# Patient Record
Sex: Male | Born: 1995 | Race: White | Hispanic: No | Smoking: Current every day smoker
Health system: Southern US, Community
[De-identification: ages and names within clinical notes are randomized; demographics above are authoritative.]

## PROBLEM LIST (undated history)

## (undated) DIAGNOSIS — Z87442 Personal history of urinary calculi: Secondary | ICD-10-CM

## (undated) DIAGNOSIS — T8859XA Other complications of anesthesia, initial encounter: Secondary | ICD-10-CM

## (undated) DIAGNOSIS — K219 Gastro-esophageal reflux disease without esophagitis: Secondary | ICD-10-CM

## (undated) DIAGNOSIS — K625 Hemorrhage of anus and rectum: Secondary | ICD-10-CM

## (undated) DIAGNOSIS — R109 Unspecified abdominal pain: Secondary | ICD-10-CM

## (undated) DIAGNOSIS — R319 Hematuria, unspecified: Secondary | ICD-10-CM

## (undated) DIAGNOSIS — T4145XA Adverse effect of unspecified anesthetic, initial encounter: Secondary | ICD-10-CM

## (undated) DIAGNOSIS — D649 Anemia, unspecified: Secondary | ICD-10-CM

## (undated) HISTORY — PX: WISDOM TOOTH EXTRACTION: SHX21

## (undated) HISTORY — PX: ELBOW SURGERY: SHX618

## (undated) HISTORY — PX: OTHER SURGICAL HISTORY: SHX169

---
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
## 2014-12-21 ENCOUNTER — Emergency Department: Payer: Self-pay | Admitting: Emergency Medicine

## 2015-10-06 ENCOUNTER — Other Ambulatory Visit: Payer: Self-pay | Admitting: Physician Assistant

## 2015-10-06 DIAGNOSIS — R1031 Right lower quadrant pain: Secondary | ICD-10-CM

## 2015-10-06 DIAGNOSIS — R634 Abnormal weight loss: Secondary | ICD-10-CM

## 2015-10-06 DIAGNOSIS — R1032 Left lower quadrant pain: Principal | ICD-10-CM

## 2015-10-06 DIAGNOSIS — R61 Generalized hyperhidrosis: Secondary | ICD-10-CM

## 2015-10-06 DIAGNOSIS — K625 Hemorrhage of anus and rectum: Secondary | ICD-10-CM

## 2015-10-11 ENCOUNTER — Ambulatory Visit
Admission: RE | Admit: 2015-10-11 | Discharge: 2015-10-11 | Disposition: A | Discharge status: Home / Self Care | Payer: Federal, State, Local not specified - PPO | Source: Ambulatory Visit | Attending: Physician Assistant | Admitting: Physician Assistant

## 2015-10-11 DIAGNOSIS — K625 Hemorrhage of anus and rectum: Secondary | ICD-10-CM | POA: Diagnosis not present

## 2015-10-11 DIAGNOSIS — R1031 Right lower quadrant pain: Secondary | ICD-10-CM | POA: Diagnosis present

## 2015-10-11 DIAGNOSIS — R1032 Left lower quadrant pain: Secondary | ICD-10-CM | POA: Diagnosis present

## 2015-10-11 DIAGNOSIS — R61 Generalized hyperhidrosis: Secondary | ICD-10-CM

## 2015-10-11 DIAGNOSIS — R634 Abnormal weight loss: Secondary | ICD-10-CM

## 2015-10-11 MED ORDER — IOHEXOL 300 MG/ML  SOLN
100.0000 mL | Freq: Once | INTRAMUSCULAR | Status: AC | PRN
  Administered 2015-10-11: 100 mL via INTRAVENOUS

## 2015-10-24 ENCOUNTER — Ambulatory Visit: Payer: Federal, State, Local not specified - PPO | Admitting: Anesthesiology

## 2015-10-24 ENCOUNTER — Ambulatory Visit
Admission: RE | Admit: 2015-10-24 | Discharge: 2015-10-24 | Disposition: A | Discharge status: Home / Self Care | Payer: Federal, State, Local not specified - PPO | Source: Ambulatory Visit | Attending: Gastroenterology | Admitting: Gastroenterology

## 2015-10-24 ENCOUNTER — Encounter: Payer: Self-pay | Admitting: *Deleted

## 2015-10-24 ENCOUNTER — Encounter
Admission: RE | Disposition: A | Discharge status: Home / Self Care | Payer: Self-pay | Source: Ambulatory Visit | Attending: Gastroenterology

## 2015-10-24 DIAGNOSIS — K625 Hemorrhage of anus and rectum: Secondary | ICD-10-CM | POA: Diagnosis not present

## 2015-10-24 DIAGNOSIS — Z809 Family history of malignant neoplasm, unspecified: Secondary | ICD-10-CM | POA: Diagnosis not present

## 2015-10-24 DIAGNOSIS — R634 Abnormal weight loss: Secondary | ICD-10-CM | POA: Insufficient documentation

## 2015-10-24 DIAGNOSIS — Z800 Family history of malignant neoplasm of digestive organs: Secondary | ICD-10-CM | POA: Insufficient documentation

## 2015-10-24 DIAGNOSIS — Z9889 Other specified postprocedural states: Secondary | ICD-10-CM | POA: Diagnosis not present

## 2015-10-24 DIAGNOSIS — Z8349 Family history of other endocrine, nutritional and metabolic diseases: Secondary | ICD-10-CM | POA: Insufficient documentation

## 2015-10-24 DIAGNOSIS — Z8249 Family history of ischemic heart disease and other diseases of the circulatory system: Secondary | ICD-10-CM | POA: Diagnosis not present

## 2015-10-24 DIAGNOSIS — R1031 Right lower quadrant pain: Secondary | ICD-10-CM | POA: Insufficient documentation

## 2015-10-24 DIAGNOSIS — Z79899 Other long term (current) drug therapy: Secondary | ICD-10-CM | POA: Diagnosis not present

## 2015-10-24 DIAGNOSIS — R61 Generalized hyperhidrosis: Secondary | ICD-10-CM | POA: Diagnosis not present

## 2015-10-24 DIAGNOSIS — R1032 Left lower quadrant pain: Secondary | ICD-10-CM | POA: Insufficient documentation

## 2015-10-24 DIAGNOSIS — K219 Gastro-esophageal reflux disease without esophagitis: Secondary | ICD-10-CM | POA: Insufficient documentation

## 2015-10-24 HISTORY — DX: Gastro-esophageal reflux disease without esophagitis: K21.9

## 2015-10-24 HISTORY — PX: COLONOSCOPY WITH PROPOFOL: SHX5780

## 2015-10-24 HISTORY — DX: Hemorrhage of anus and rectum: K62.5

## 2015-10-24 HISTORY — DX: Unspecified abdominal pain: R10.9

## 2015-10-24 HISTORY — DX: Anemia, unspecified: D64.9

## 2015-10-24 HISTORY — DX: Hematuria, unspecified: R31.9

## 2015-10-24 SURGERY — COLONOSCOPY WITH PROPOFOL
Anesthesia: General

## 2015-10-24 MED ORDER — MIDAZOLAM HCL 2 MG/2ML IJ SOLN
INTRAMUSCULAR | Status: DC | PRN
  Administered 2015-10-24: 1 mg via INTRAVENOUS

## 2015-10-24 MED ORDER — SODIUM CHLORIDE 0.9 % IV SOLN
INTRAVENOUS | Status: DC
  Administered 2015-10-24: 15:00:00 via INTRAVENOUS

## 2015-10-24 MED ORDER — PROPOFOL 500 MG/50ML IV EMUL
INTRAVENOUS | Status: DC | PRN
Start: 2015-10-24 — End: 2015-10-24
  Administered 2015-10-24: 110 ug/kg/min via INTRAVENOUS

## 2015-10-24 MED ORDER — FENTANYL CITRATE (PF) 100 MCG/2ML IJ SOLN
INTRAMUSCULAR | Status: DC | PRN
  Administered 2015-10-24: 50 ug via INTRAVENOUS

## 2015-10-24 MED ORDER — SODIUM CHLORIDE 0.9 % IV SOLN: INTRAVENOUS | Status: DC

## 2015-10-24 MED ORDER — SODIUM CHLORIDE 0.9 % IV SOLN
INTRAVENOUS | Status: DC
  Administered 2015-10-24: 1000 mL via INTRAVENOUS

## 2015-10-24 NOTE — Anesthesia Postprocedure Evaluation (Signed)
Anesthesia Post Note  Patient: Barnie AldermanJamison C Girton  Procedure(s) Performed: Procedure(s) (LRB): COLONOSCOPY WITH PROPOFOL (N/A)  Patient location during evaluation: Endoscopy Anesthesia Type: General Level of consciousness: awake Pain management: pain level controlled Vital Signs Assessment: post-procedure vital signs reviewed and stable Respiratory status: spontaneous breathing Cardiovascular status: blood pressure returned to baseline Postop Assessment: No headache Anesthetic complications: no    Last Vitals:  Filed Vitals:   10/24/15 1423 10/24/15 1514  BP: 90/70 89/62  Pulse: 118 79  Temp: 36.8 C 36.3 C  Resp: 18     Last Pain: There were no vitals filed for this visit.               Bonnell Placzek M

## 2015-10-24 NOTE — H&P (Signed)
  Date of Initial H&P: 10/06/2015 History reviewed, patient examined, no change in status, stable for surgery. 

## 2015-10-24 NOTE — Anesthesia Preprocedure Evaluation (Signed)
Anesthesia Evaluation  Patient identified by MRN, date of birth, ID band Patient awake    Reviewed: Allergy & Precautions, NPO status , Patient's Chart, lab work & pertinent test results  Airway Mallampati: I  TM Distance: >3 FB Neck ROM: Full    Dental  (+) Teeth Intact   Pulmonary    Pulmonary exam normal        Cardiovascular Exercise Tolerance: Good negative cardio ROS Normal cardiovascular exam     Neuro/Psych    GI/Hepatic GERD  Medicated and Controlled,  Endo/Other    Renal/GU      Musculoskeletal   Abdominal Normal abdominal exam  (+)   Peds  Hematology   Anesthesia Other Findings   Reproductive/Obstetrics                             Anesthesia Physical Anesthesia Plan  ASA: I  Anesthesia Plan: General   Post-op Pain Management:    Induction: Intravenous  Airway Management Planned: Nasal Cannula  Additional Equipment:   Intra-op Plan:   Post-operative Plan:   Informed Consent: I have reviewed the patients History and Physical, chart, labs and discussed the procedure including the risks, benefits and alternatives for the proposed anesthesia with the patient or authorized representative who has indicated his/her understanding and acceptance.     Plan Discussed with: CRNA  Anesthesia Plan Comments:         Anesthesia Quick Evaluation

## 2015-10-24 NOTE — Op Note (Signed)
[facility] Patient Name: Dustin Butler Procedure Date: 10/24/2015 2:49 PM MRN: [id number] Account #: [id number] Date of Birth: [birth year] Admit Type: Outpatient Age: 19 Room: [facility] ENDO ROOM 4 Gender: Male Note Status: Finalized Procedure:         Colonoscopy Indications:       Lower abdominal pain, Rectal bleeding, Weight loss Providers:         Ezzard Standing. Bluford Kaufmann, MD Referring MD:      Marisue Ivan (Referring MD) Medicines:         Monitored Anesthesia Care Complications:     No immediate complications. Procedure:         Pre-Anesthesia Assessment:                    - Prior to the procedure, a History and Physical was                     performed, and patient medications, allergies and                     sensitivities were reviewed. The patient's tolerance of                     previous anesthesia was reviewed.                    - The risks and benefits of the procedure and the sedation                     options and risks were discussed with the patient. All                     questions were answered and informed consent was obtained.                    - The risks and benefits of the procedure and the sedation                     options and risks were discussed with the patient. All                     questions were answered and informed consent was obtained.                    - The risks and benefits of the procedure and the sedation                     options and risks were discussed with the patient. All                     questions were answered and informed consent was obtained.                    - After reviewing the risks and benefits, the patient was                     deemed in satisfactory condition to undergo the procedure.                    After obtaining informed consent, the colonoscope was                     passed under direct vision. Throughout the procedure, the  patient's blood pressure,  pulse, and oxygen saturations                     were monitored continuously. The Colonoscope was                     introduced through the anus and advanced to the the cecum,                     identified by appendiceal orifice and ileocecal valve. The                     colonoscopy was performed without difficulty. The patient                     tolerated the procedure well. The quality of the bowel                     preparation was good. Findings:      The colon (entire examined portion) appeared normal. Spent lot of time       trying to intubate TI without success. Biopsies taken from TOI entrance.      The exam was otherwise without abnormality. Impression:        - The entire examined colon is normal.                    - The examination was otherwise normal.                    - No specimens collected. Recommendation:    - Discharge patient to home.                    - Await pathology results.                    - The findings and recommendations were discussed with the                     patient.                    - Order CT of abdomen and pelvis to evaluate TI better. Procedure Code(s): --- Professional ---                    [phone], Colonoscopy, flexible; diagnostic, including                     collection of specimen(s) by brushing or washing, when                     performed (separate procedure) Diagnosis Code(s): --- Professional ---                    R10.30, Lower abdominal pain, unspecified                    K62.5, Hemorrhage of anus and rectum                    R63.4, Abnormal weight loss CPT copyright 2014 American Medical Association. All rights reserved. The codes documented in this report are preliminary and upon coder review may  be revised to meet current compliance requirements. Wallace Cullens, MD 10/24/2015 3:10:51 PM This report has been signed electronically. Number of Addenda: 0 Note Initiated On: 10/24/2015 2:49 PM  Scope Withdrawal Time: 0  hours 8 minutes 47 seconds  Total Procedure Duration: 0 hours 12 minutes 10 seconds       [facility]

## 2015-10-24 NOTE — Transfer of Care (Signed)
Immediate Anesthesia Transfer of Care Note  Patient: Dustin Butler  Procedure(s) Performed: Procedure(s): COLONOSCOPY WITH PROPOFOL (N/A)  Patient Location: PACU and Endoscopy Unit  Anesthesia Type:General  Level of Consciousness: sedated and responds to stimulation  Airway & Oxygen Therapy: Patient Spontanous Breathing and Patient connected to nasal cannula oxygen  Post-op Assessment: Report given to RN and Post -op Vital signs reviewed and stable  Post vital signs: Reviewed and stable  Last Vitals:  Filed Vitals:   10/24/15 1423 10/24/15 1514  BP: 90/70 89/62  Pulse: 118 79  Temp: 36.8 C 36.3 C  Resp: 18     Complications: No apparent anesthesia complications

## 2015-10-25 ENCOUNTER — Encounter: Payer: Self-pay | Admitting: Gastroenterology

## 2015-10-26 LAB — SURGICAL PATHOLOGY

## 2016-06-11 ENCOUNTER — Emergency Department
Admission: EM | Admit: 2016-06-11 | Discharge: 2016-06-11 | Disposition: A | Discharge status: Home / Self Care | Payer: Federal, State, Local not specified - PPO | Attending: Emergency Medicine | Admitting: Emergency Medicine

## 2016-06-11 ENCOUNTER — Emergency Department: Payer: Federal, State, Local not specified - PPO

## 2016-06-11 ENCOUNTER — Encounter: Payer: Self-pay | Admitting: Emergency Medicine

## 2016-06-11 DIAGNOSIS — Y939 Activity, unspecified: Secondary | ICD-10-CM | POA: Insufficient documentation

## 2016-06-11 DIAGNOSIS — Z79899 Other long term (current) drug therapy: Secondary | ICD-10-CM | POA: Diagnosis not present

## 2016-06-11 DIAGNOSIS — F17210 Nicotine dependence, cigarettes, uncomplicated: Secondary | ICD-10-CM | POA: Diagnosis not present

## 2016-06-11 DIAGNOSIS — S62101A Fracture of unspecified carpal bone, right wrist, initial encounter for closed fracture: Secondary | ICD-10-CM

## 2016-06-11 DIAGNOSIS — S6991XA Unspecified injury of right wrist, hand and finger(s), initial encounter: Secondary | ICD-10-CM | POA: Diagnosis present

## 2016-06-11 DIAGNOSIS — M25511 Pain in right shoulder: Secondary | ICD-10-CM | POA: Diagnosis not present

## 2016-06-11 DIAGNOSIS — Z8719 Personal history of other diseases of the digestive system: Secondary | ICD-10-CM | POA: Diagnosis not present

## 2016-06-11 DIAGNOSIS — Y999 Unspecified external cause status: Secondary | ICD-10-CM | POA: Insufficient documentation

## 2016-06-11 DIAGNOSIS — S62111A Displaced fracture of triquetrum [cuneiform] bone, right wrist, initial encounter for closed fracture: Secondary | ICD-10-CM | POA: Insufficient documentation

## 2016-06-11 DIAGNOSIS — Y929 Unspecified place or not applicable: Secondary | ICD-10-CM | POA: Diagnosis not present

## 2016-06-11 MED ORDER — OXYCODONE-ACETAMINOPHEN 5-325 MG PO TABS
2.0000 | ORAL_TABLET | Freq: Once | ORAL | Status: AC
  Administered 2016-06-11: 2 via ORAL
  Filled 2016-06-11: qty 2

## 2016-06-11 MED ORDER — OXYCODONE-ACETAMINOPHEN 5-325 MG PO TABS: 1.0000 | ORAL_TABLET | ORAL | Status: DC | PRN

## 2016-06-11 MED ORDER — NAPROXEN 500 MG PO TABS: 500.0000 mg | ORAL_TABLET | Freq: Two times a day (BID) | ORAL | Status: DC
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
## 2016-06-11 NOTE — ED Notes (Signed)
Wrist immobilized and Ice applied.

## 2016-06-11 NOTE — Discharge Instructions (Signed)
Wrist Fracture °A wrist fracture is a break or crack in one of the bones of your wrist. Your wrist is made up of eight small bones at the palm of your hand (carpal bones) and two long bones that make up your forearm (radius and ulna). °CAUSES °· A direct blow to the wrist. °· Falling on an outstretched hand. °· Trauma, such as a car accident or a fall. °RISK FACTORS °Risk factors for wrist fracture include: °· Participating in contact and high-risk sports, such as skiing, biking, and ice skating. °· Taking steroid medicines. °· Smoking. °· Being male. °· Being Caucasian. °· Drinking more than three alcoholic beverages per day. °· Having low or lowered bone density (osteoporosis or osteopenia). °· Age. Older adults have decreased bone density. °· Women who have had menopause. °· History of previous fractures. °SIGNS AND SYMPTOMS °Symptoms of wrist fractures include tenderness, bruising, and inflammation. Additionally, the wrist may hang in an odd position or appear deformed. °DIAGNOSIS °Diagnosis may include: °· Physical exam. °· X-ray. °TREATMENT °Treatment depends on many factors, including the nature and location of the fracture, your age, and your activity level. Treatment for wrist fracture can be nonsurgical or surgical. °Nonsurgical Treatment °A plaster cast or splint may be applied to your wrist if the bone is in a good position. If the fracture is not in good position, it may be necessary for your health care provider to realign it before applying a splint or cast. Usually, a cast or splint will be worn for several weeks. °Surgical Treatment °Sometimes the position of the bone is so far out of place that surgery is required to apply a device to hold it together as it heals. Depending on the fracture, there are a number of options for holding the bone in place while it heals, such as a cast and metal pins. °HOME CARE INSTRUCTIONS °· Keep your injured wrist elevated and move your fingers as much as  possible. °· Do not put pressure on any part of your cast or splint. It may break. °· Use a plastic bag to protect your cast or splint from water while bathing or showering. Do not lower your cast or splint into water. °· Take medicines only as directed by your health care provider. °· Keep your cast or splint clean and dry. If it becomes wet, damaged, or suddenly feels too tight, contact your health care provider right away. °· Do not use any tobacco products including cigarettes, chewing tobacco, or electronic cigarettes. Tobacco can delay bone healing. If you need help quitting, ask your health care provider. °· Keep all follow-up visits as directed by your health care provider. This is important. °· Ask your health care provider if you should take supplements of calcium and vitamins C and D to promote bone healing. °SEEK MEDICAL CARE IF: °· Your cast or splint is damaged, breaks, or gets wet. °· You have a fever. °· You have chills. °· You have continued severe pain or more swelling than you did before the cast was put on. °SEEK IMMEDIATE MEDICAL CARE IF: °· Your hand or fingernails on the injured arm turn blue or gray, or feel cold or numb. °· You have decreased feeling in the fingers of your injured arm. °MAKE SURE YOU: °· Understand these instructions. °· Will watch your condition. °· Will get help right away if you are not doing well or get worse. °  °This information is not intended to replace advice given to you by your   health care provider. Make sure you discuss any questions you have with your health care provider. °  °Document Released: 08/29/2005 Document Revised: 08/10/2015 Document Reviewed: 12/07/2011 °Elsevier Interactive Patient Education ©2016 [employer] ° °
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
## 2016-12-12 ENCOUNTER — Other Ambulatory Visit: Payer: Self-pay | Admitting: Internal Medicine

## 2016-12-12 DIAGNOSIS — R319 Hematuria, unspecified: Secondary | ICD-10-CM

## 2016-12-14 ENCOUNTER — Encounter: Payer: Self-pay | Admitting: Emergency Medicine

## 2016-12-14 ENCOUNTER — Emergency Department
Admission: EM | Admit: 2016-12-14 | Discharge: 2016-12-14 | Disposition: A | Discharge status: Home / Self Care | Payer: Federal, State, Local not specified - PPO | Attending: Emergency Medicine | Admitting: Emergency Medicine

## 2016-12-14 ENCOUNTER — Emergency Department: Payer: Federal, State, Local not specified - PPO

## 2016-12-14 DIAGNOSIS — N201 Calculus of ureter: Secondary | ICD-10-CM | POA: Diagnosis not present

## 2016-12-14 DIAGNOSIS — F17210 Nicotine dependence, cigarettes, uncomplicated: Secondary | ICD-10-CM | POA: Diagnosis not present

## 2016-12-14 DIAGNOSIS — R109 Unspecified abdominal pain: Secondary | ICD-10-CM | POA: Diagnosis present

## 2016-12-14 LAB — URINALYSIS, COMPLETE (UACMP) WITH MICROSCOPIC
BACTERIA UA: NONE SEEN
Bilirubin Urine: NEGATIVE
Glucose, UA: NEGATIVE mg/dL
KETONES UR: NEGATIVE mg/dL
NITRITE: NEGATIVE
PROTEIN: 30 mg/dL — AB
Specific Gravity, Urine: 1.021 (ref 1.005–1.030)
Squamous Epithelial / LPF: NONE SEEN
pH: 6 (ref 5.0–8.0)

## 2016-12-14 LAB — CBC WITH DIFFERENTIAL/PLATELET
BASOS ABS: 0 10*3/uL (ref 0–0.1)
Basophils Relative: 0 %
EOS ABS: 0 10*3/uL (ref 0–0.7)
EOS PCT: 0 %
HCT: 42.5 % (ref 40.0–52.0)
Hemoglobin: 14.6 g/dL (ref 13.0–18.0)
Lymphocytes Relative: 5 %
Lymphs Abs: 0.7 10*3/uL — ABNORMAL LOW (ref 1.0–3.6)
MCH: 32.5 pg (ref 26.0–34.0)
MCHC: 34.3 g/dL (ref 32.0–36.0)
MCV: 95 fL (ref 80.0–100.0)
Monocytes Absolute: 0.6 10*3/uL (ref 0.2–1.0)
Monocytes Relative: 3 %
Neutro Abs: 15.1 10*3/uL — ABNORMAL HIGH (ref 1.4–6.5)
Neutrophils Relative %: 92 %
PLATELETS: 195 10*3/uL (ref 150–440)
RBC: 4.48 MIL/uL (ref 4.40–5.90)
RDW: 13.2 % (ref 11.5–14.5)
WBC: 16.5 10*3/uL — AB (ref 3.8–10.6)

## 2016-12-14 LAB — COMPREHENSIVE METABOLIC PANEL
ALT: 14 U/L — AB (ref 17–63)
AST: 24 U/L (ref 15–41)
Albumin: 4.7 g/dL (ref 3.5–5.0)
Alkaline Phosphatase: 65 U/L (ref 38–126)
Anion gap: 8 (ref 5–15)
BUN: 13 mg/dL (ref 6–20)
CHLORIDE: 104 mmol/L (ref 101–111)
CO2: 26 mmol/L (ref 22–32)
CREATININE: 1.09 mg/dL (ref 0.61–1.24)
Calcium: 9.8 mg/dL (ref 8.9–10.3)
GFR calc Af Amer: >60 mL/min (ref >60)
GFR calc non Af Amer: >60 mL/min (ref >60)
Glucose, Bld: 119 mg/dL — ABNORMAL HIGH (ref 65–99)
Potassium: 4.2 mmol/L (ref 3.5–5.1)
SODIUM: 138 mmol/L (ref 135–145)
Total Bilirubin: 0.7 mg/dL (ref 0.3–1.2)
Total Protein: 7.7 g/dL (ref 6.5–8.1)

## 2016-12-14 MED ORDER — NAPROXEN 500 MG PO TABS: 500.0000 mg | ORAL_TABLET | Freq: Two times a day (BID) | ORAL | 2 refills | Status: DC

## 2016-12-14 MED ORDER — ONDANSETRON 4 MG PO TBDP: 4.0000 mg | ORAL_TABLET | Freq: Three times a day (TID) | ORAL | 0 refills | Status: AC | PRN

## 2016-12-14 MED ORDER — TAMSULOSIN HCL 0.4 MG PO CAPS: 0.4000 mg | ORAL_CAPSULE | Freq: Every day | ORAL | 0 refills | Status: DC

## 2016-12-14 MED ORDER — HYDROCODONE-ACETAMINOPHEN 5-325 MG PO TABS: 1.0000 | ORAL_TABLET | ORAL | 0 refills | Status: DC | PRN

## 2016-12-14 MED ORDER — KETOROLAC TROMETHAMINE 30 MG/ML IJ SOLN
30.0000 mg | Freq: Once | INTRAMUSCULAR | Status: AC
  Administered 2016-12-14: 30 mg via INTRAVENOUS
  Filled 2016-12-14: qty 1

## 2016-12-14 NOTE — ED Provider Notes (Signed)
[facility] Emergency Department Provider Note   ____________________________________________    I have reviewed the triage vital signs and the nursing notes.   HISTORY  Chief Complaint Flank Pain     HPI Dustin Butler is a 21 y.o. male who presents with complaints of left flank pain. He also notes hematuria, he reports the pain started yesterday and was mild but today became severe. It radiates to his groin. He notes a history of kidney stones in his family. No fevers or chills. No dysuria. Mild nausea, no vomiting. He reports the pain as stabbing and sharp and severe.   Past Medical History:  Diagnosis Date  . Abdominal cramps   . Anemia   . BRBPR (bright red blood per rectum)   . GERD (gastroesophageal reflux disease)   . Hematuria     Patient Active Problem List   Diagnosis Date Noted  . Headache(784.0) 04/14/2013  . Myalgia 04/14/2013  . Dehydration 04/14/2013    Past Surgical History:  Procedure Laterality Date  . COLONOSCOPY WITH PROPOFOL N/A 10/24/2015   Procedure: COLONOSCOPY WITH PROPOFOL;  Surgeon: Wallace Cullens, MD;  Location: [facility] ENDOSCOPY;  Service: Gastroenterology;  Laterality: N/A;  . NO PAST SURGERIES    . spider bite      Prior to Admission medications   Medication Sig Start Date End Date Taking? Authorizing Provider  HYDROcodone-acetaminophen (NORCO/VICODIN) 5-325 MG tablet Take 1 tablet by mouth every 4 (four) hours as needed for moderate pain. 12/14/16   Jene Every, MD  naproxen (NAPROSYN) 500 MG tablet Take 1 tablet (500 mg total) by mouth 2 (two) times daily with a meal. 12/14/16   Jene Every, MD  ondansetron (ZOFRAN ODT) 4 MG disintegrating tablet Take 1 tablet (4 mg total) by mouth every 8 (eight) hours as needed for nausea or vomiting. 12/14/16   Jene Every, MD  tamsulosin (FLOMAX) 0.4 MG CAPS capsule Take 1 capsule (0.4 mg total) by mouth daily. 12/14/16   Jene Every, MD     Allergies Patient has  no known allergies.  No family history on file.  Social History Social History  Substance Use Topics  . Smoking status: Current Every Day Smoker    Packs/day: 1.00    Types: Cigarettes  . Smokeless tobacco: Current User  . Alcohol use No    Review of Systems  Constitutional: No fever/chills  Cardiovascular: Denies chest pain. Respiratory: Denies shortness of breath. Gastrointestinal: As above   Genitourinary: Negative for dysuria. Positive for hematuria Musculoskeletal: Negative for back pain. Skin: Negative for rash.   10-point ROS otherwise negative.  ____________________________________________   PHYSICAL EXAM:  VITAL SIGNS: ED Triage Vitals [12/14/16 1334]  Enc Vitals Group     BP 134/78     Pulse Rate 88     Resp 16     Temp 98.4 F (36.9 C)     Temp Source Oral     SpO2 97 %     Weight 125 lb (56.7 kg)     Height 6' (1.829 m)     Head Circumference      Peak Flow      Pain Score      Pain Loc      Pain Edu?      Excl. in GC?     Constitutional: Alert and oriented. No acute distress. Pleasant and interactive Eyes: Conjunctivae are normal.    Mouth/Throat: Mucous membranes are moist.    Cardiovascular: Normal rate, regular  rhythm. Grossly normal heart sounds.  Good peripheral circulation. Respiratory: Normal respiratory effort.  No retractions. Lungs CTAB. Gastrointestinal: Soft and nontender. No distention.  No CVA tenderness. Genitourinary: deferred Musculoskeletal: No lower extremity tenderness nor edema.  Warm and well perfused Neurologic:  Normal speech and language. No gross focal neurologic deficits are appreciated.  Skin:  Skin is warm, dry and intact. No rash noted. Psychiatric: Mood and affect are normal. Speech and behavior are normal.  ____________________________________________   LABS (all labs ordered are listed, but only abnormal results are displayed)  Labs Reviewed  CBC WITH DIFFERENTIAL/PLATELET - Abnormal; Notable for  the following:       Result Value   WBC 16.5 (*)    Neutro Abs 15.1 (*)    Lymphs Abs 0.7 (*)    All other components within normal limits  COMPREHENSIVE METABOLIC PANEL - Abnormal; Notable for the following:    Glucose, Bld 119 (*)    ALT 14 (*)    All other components within normal limits  URINALYSIS, COMPLETE (UACMP) WITH MICROSCOPIC - Abnormal; Notable for the following:    Color, Urine YELLOW (*)    APPearance HAZY (*)    Hgb urine dipstick LARGE (*)    Protein, ur 30 (*)    Leukocytes, UA TRACE (*)    All other components within normal limits   ____________________________________________  EKG  None ____________________________________________  RADIOLOGY  CT renal stone study ____________________________________________   PROCEDURES  Procedure(s) performed: No    Critical Care performed: No ____________________________________________   INITIAL IMPRESSION / ASSESSMENT AND PLAN / ED COURSE  Pertinent labs & imaging results that were available during my care of the patient were reviewed by me and considered in my medical decision making (see chart for details).  Patient presents with complaints of left flank pain, and hematuria. Strongly suspicious of kidney stone, we will give IV Toradol obtain CT renal stone study and reevaluate.  Clinical Course   Patient's pain relieved by Toradol completely. CT scan demonstrates a 3 mm stone. No evidence of infection. No dysuria. Outpatient follow-up with urology, return precautions discussed including fevers chills dysuria worsening pain ____________________________________________   FINAL CLINICAL IMPRESSION(S) / ED DIAGNOSES  Final diagnoses:  Flank pain, acute  Ureterolithiasis      NEW MEDICATIONS STARTED DURING THIS VISIT:  Discharge Medication List as of 12/14/2016  3:36 PM    START taking these medications   Details  HYDROcodone-acetaminophen (NORCO/VICODIN) 5-325 MG tablet Take 1 tablet by mouth  every 4 (four) hours as needed for moderate pain., Starting Fri 12/14/2016, Print    ondansetron (ZOFRAN ODT) 4 MG disintegrating tablet Take 1 tablet (4 mg total) by mouth every 8 (eight) hours as needed for nausea or vomiting., Starting Fri 12/14/2016, Print    tamsulosin (FLOMAX) 0.4 MG CAPS capsule Take 1 capsule (0.4 mg total) by mouth daily., Starting Fri 12/14/2016, Print         Note:  This document was prepared using Dragon voice recognition software and may include unintentional dictation errors.    Jene Everyobert Chameka Mcmullen, MD 12/14/16 [phone]

## 2016-12-17 ENCOUNTER — Ambulatory Visit: Payer: Federal, State, Local not specified - PPO

## 2016-12-17 ENCOUNTER — Ambulatory Visit (INDEPENDENT_AMBULATORY_CARE_PROVIDER_SITE_OTHER): Payer: Federal, State, Local not specified - PPO | Admitting: Urology

## 2016-12-17 ENCOUNTER — Encounter: Payer: Self-pay | Admitting: Urology

## 2016-12-17 VITALS — BP 104/65 | HR 84 | Ht 72.0 in | Wt 128.3 lb

## 2016-12-17 DIAGNOSIS — R310 Gross hematuria: Secondary | ICD-10-CM

## 2016-12-17 DIAGNOSIS — N201 Calculus of ureter: Secondary | ICD-10-CM

## 2016-12-17 DIAGNOSIS — N200 Calculus of kidney: Secondary | ICD-10-CM | POA: Diagnosis not present

## 2016-12-17 DIAGNOSIS — N132 Hydronephrosis with renal and ureteral calculous obstruction: Secondary | ICD-10-CM

## 2016-12-17 LAB — MICROSCOPIC EXAMINATION
BACTERIA UA: NONE SEEN
Epithelial Cells (non renal): NONE SEEN /hpf (ref 0–10)
RBC, UA: >30 /hpf — AB (ref 0–?)
WBC UA: NONE SEEN /HPF (ref 0–?)

## 2016-12-17 LAB — URINALYSIS, COMPLETE
Bilirubin, UA: NEGATIVE
Glucose, UA: NEGATIVE
Ketones, UA: NEGATIVE
LEUKOCYTES UA: NEGATIVE
Nitrite, UA: NEGATIVE
PH UA: 6.5 (ref 5.0–7.5)
Specific Gravity, UA: 1.02 (ref 1.005–1.030)
Urobilinogen, Ur: 0.2 mg/dL (ref 0.2–1.0)

## 2016-12-17 NOTE — Progress Notes (Signed)
12/17/2016 4:20 PM   Dustin Butler [birth year] [id number]  Referring provider: [name], MD [facility] [place], [place] [address]  Chief Complaint  Patient presents with  . New Patient (Initial Visit)    kidney stone referred by the ER    HPI: Patient is a 21 year old Caucasian male who presents/is referred by [facility] ED for nephrolithiasis.  Patient states the onset of the pain was two weeks ago.   It was sharp.  It lasted for several hours off and on over the last two weeks.  He had gross hematuria associated with the stone,  He has had nausea and vomiting.  He has not had any fevers or chills.  .  The pain was located lower back and radiated to the  and radiated to left flank.  The pain was a 10/10.  Nothing made the pain better.   Nothing made the pain worse.  In the ED, he received Vicodin, Toradol, naprosyn, Zofran and tamsulosin.  His UA demonstrated TNTC RBC's and 6-30 WBC's.  .  Serum creatinine 1.09.  His WBC count was 16.5.    CT Renal stone study was performed on 12/14/2016 noted since the previous study, the patient has developed renal stone disease bilaterally in a pattern suggesting medullary sponge kidney with multiple tiny medullary calcifications bilaterally. On the left, there are 3 slightly larger nonobstructing stones in the kidney measuring about 2 mm in size. There is a 5 mm stone in the left ureter at the L3 level with mild to moderate left hydroureteronephrosis. This has somewhat angular margins.  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  I have independently reviewed the films.     Today, his pain is in his lower stomach.  3/10 pain at this time.  8/10 this morning before taking a Vicodin.  He is taking tamsulosin today.  His has been pushing his fluids.  He has not any recent fevers or chills.  He has not had nausea or vomiting.   UA demonstrates > 30 RBC's.    He does not have a prior  history of stones.    PMH: Past Medical History:  Diagnosis Date  . Abdominal cramps   . Anemia   . BRBPR (bright red blood per rectum)   . GERD (gastroesophageal reflux disease)   . Hematuria     Surgical History: Past Surgical History:  Procedure Laterality Date  . COLONOSCOPY WITH PROPOFOL N/A 10/24/2015   Procedure: COLONOSCOPY WITH PROPOFOL;  Surgeon: [name], MD;  Location: [name] ENDOSCOPY;  Service: Gastroenterology;  Laterality: N/A;  . spider bite      Home Medications:  Allergies as of 12/17/2016   No Known Allergies     Medication List       Accurate as of 12/17/16  4:20 PM. Always use your most recent med list.          HYDROcodone-acetaminophen 5-325 MG tablet Commonly known as:  NORCO/VICODIN Take 1 tablet by mouth every 4 (four) hours as needed for moderate pain.   naproxen 500 MG tablet Commonly known as:  NAPROSYN Take 1 tablet (500 mg total) by mouth 2 (two) times daily with a meal.   ondansetron 4 MG disintegrating tablet Commonly known as:  ZOFRAN ODT Take 1 tablet (4 mg total) by mouth every 8 (eight) hours as needed for nausea or vomiting.   tamsulosin 0.4 MG Caps capsule Commonly known as:  FLOMAX Take 1 capsule (0.4 mg total) by mouth daily.       Allergies: No Known Allergies  Family History: Family History  Problem Relation Age of Onset  . Prostate cancer Father   . Kidney cancer Neg Hx   . Bladder Cancer Neg Hx     Social History:  reports that he has been smoking Cigarettes.  He has been smoking about 1.00 pack per day. He has quit using smokeless tobacco. His smokeless tobacco use included Chew. He reports that he drinks alcohol. He reports that he does not use drugs.  ROS: UROLOGY Frequent Urination?: Yes Hard to postpone urination?: Yes Burning/pain with urination?: No Get up at night to urinate?: No Leakage of urine?: Yes Urine stream starts and stops?: No Trouble starting stream?: No Do you have to strain to  urinate?: No Blood in urine?: Yes Urinary tract infection?: No Sexually transmitted disease?: No Injury to kidneys or bladder?: No Painful intercourse?: No Weak stream?: No Erection problems?: No Penile pain?: No  Gastrointestinal Nausea?: No Vomiting?: No Indigestion/heartburn?: No Diarrhea?: No Constipation?: No  Constitutional Fever: No Night sweats?: No Weight loss?: No Fatigue?: No  Skin Skin rash/lesions?: No Itching?: No  Eyes Blurred vision?: No Double vision?: No  Ears/Nose/Throat Sore throat?: No Sinus problems?: No  Hematologic/Lymphatic Swollen glands?: No Easy bruising?: No  Cardiovascular Leg swelling?: No Chest pain?: No  Respiratory Cough?: No Shortness of breath?: No  Endocrine Excessive thirst?: No  Musculoskeletal Back pain?: Yes Joint pain?: No  Neurological Headaches?: Yes Dizziness?: No  Psychologic Depression?: No Anxiety?: No  Physical Exam: BP 104/65   Pulse 84   Ht 6' (1.829 m)   Wt 128 lb 4.8 oz (58.2 kg)   BMI 17.40 kg/m   Constitutional: Well nourished. Alert and oriented, No acute distress. HEENT: [place] AT, moist mucus membranes. Trachea midline, no masses. Cardiovascular: No clubbing, cyanosis, or edema. Respiratory: Normal respiratory effort, no increased work of breathing. GI: Abdomen is soft, non tender, non distended, no abdominal masses. Liver and spleen not palpable.  No hernias appreciated.  Stool sample for occult testing is not indicated.   GU: No CVA tenderness.  No bladder fullness or masses.   Skin: No rashes, bruises or suspicious lesions. Lymph: No cervical or inguinal adenopathy. Neurologic: Grossly intact, no focal deficits, moving all 4 extremities. Psychiatric: Normal mood and affect.  Laboratory Data: Lab Results  Component Value Date   WBC 16.5 (H) 12/14/2016   HGB 14.6 12/14/2016   HCT 42.5 12/14/2016   MCV 95.0 12/14/2016   PLT 195 12/14/2016    Lab Results  Component Value  Date   CREATININE 1.09 12/14/2016     Lab Results  Component Value Date   AST 24 12/14/2016   Lab Results  Component Value Date   ALT 14 (L) 12/14/2016   Urinalysis > 30 RBC's.  See EPIC.    Pertinent Imaging: CLINICAL DATA:  Left-sided flank pain and hematuria, 2 days duration.  EXAM: CT ABDOMEN AND PELVIS WITHOUT CONTRAST  TECHNIQUE: Multidetector CT imaging of the abdomen and pelvis was performed following the standard protocol without IV contrast.  COMPARISON:  10/11/2015  FINDINGS: Lower chest: Normal  Hepatobiliary: Normal.  No calcified gallstones.  Pancreas: Normal  Spleen: Normal  Adrenals/Urinary Tract: The adrenal glands are normal. Right kidney contains tiny calcifications in the medullary regions possibly indicating medullary sponge kidney. Left kidney shows a similar finding with the re- slightly larger focal stones, 2 mm in diameter, 1  in the upper pole, 1 in the midportion and 1 in the lower pole. 1 cm cyst in the lower medial portion of the kidney and in the lateral midportion of the kidney. Mild to moderate hydroureteronephrosis on the left secondary to a 5 mm stone in the ureter at the L3 level. This has somewhat angular configurations. No stone in the bladder.  Stomach/Bowel: Normal  Vascular/Lymphatic: Normal  Reproductive: Normal  Other: No free fluid or air  Musculoskeletal: Chronic appearing bilateral pars defects at L5 with 3 mm of anterolisthesis.  IMPRESSION: Since the previous study, the patient has developed renal stone disease bilaterally in a pattern suggesting medullary sponge kidney with multiple tiny medullary calcifications bilaterally. On the left, there are 3 slightly larger nonobstructing stones in the kidney measuring about 2 mm in size. There is a 5 mm stone in the left ureter at the L3 level with mild to moderate left hydroureteronephrosis. This has somewhat angular margins.  Chronic bilateral  pars defects at L5 with 3 mm of anterolisthesis.   Electronically Signed   By: Nelson Chimes M.D.   On: 12/14/2016 14:58  Assessment & Plan:    Patient will undergo a left ureteroscopy with laser lithotripsy with ureteral stent placement.    1. Left ureteral stone  - 5 mm mid left ureteral stone  - discussed MET vs ESWL vs URS/LL/stent placement  - patient would like to undergo URS/LL/stent placement  - schedule LEFT ureteroscopy with laser lithotripsy and ureteral stent placement  - explained to the patient how the procedure is performed and the risks involved  - informed patient that they will have a stent placed during the procedure and will remain in place after the procedure for a short time.   - stent may be removed in the office with a cystoscope or patient may be instructed to remove the stent themselves by the string  - described "stent pain" as feelings of needing to urinate/overactive bladder and a warm, tingling sensation to intense pain in the affected flank  - residual stones within the kidney or ureter may be present after the procedure and may need to have these addressed at a different encounter  - injury to the ureter is the most common intra-operative risk, it may result in an open procedure to correct the defect  - infection and bleeding are also risks  - explained the risks of general anesthesia, such as: MI, CVA, paralysis, coma and/or death.  - UA  - urine culture  - advised to contact our office or seek treatment in the ED if becomes febrile or pain/ vomiting are difficult control in order to arrange for emergent/urgent intervention  2. Bilateral renal stones/medullary sponge kidney  - advise 24 hour once obstructing stone is removed  - explained to the patient that may or may not be able to address the left renal stones during the scheduled URS/LL/ureteral stent  3. Left hydronephrosis  - obtain RUS after obstructing stone is removed  4. Gross  hematuria  - We will continue to monitor the patient's UA after the treatment/passage of the stone to ensure the hematuria has resolved.  If hematuria persists, we will pursue a hematuria workup with CT Urogram and cystoscopy if appropriate.    Return for left URS/LL/ureteral stent.  These notes generated with voice recognition software. I apologize for typographical errors.  Zara Council, [facility] [address] [place], [place] [address] [phone]

## 2016-12-18 ENCOUNTER — Telehealth: Payer: Self-pay | Admitting: Radiology

## 2016-12-18 ENCOUNTER — Other Ambulatory Visit: Payer: Self-pay | Admitting: Radiology

## 2016-12-18 DIAGNOSIS — N201 Calculus of ureter: Secondary | ICD-10-CM

## 2016-12-18 NOTE — Telephone Encounter (Signed)
LMOM. Need to discuss upcoming surgery with Dr Brandon. 

## 2016-12-20 ENCOUNTER — Inpatient Hospital Stay: Admission: RE | Admit: 2016-12-20 | Payer: Self-pay | Source: Ambulatory Visit

## 2016-12-21 ENCOUNTER — Encounter
Admission: RE | Admit: 2016-12-21 | Discharge: 2016-12-21 | Disposition: A | Discharge status: Home / Self Care | Payer: Federal, State, Local not specified - PPO | Source: Ambulatory Visit | Attending: Urology | Admitting: Urology

## 2016-12-21 HISTORY — DX: Adverse effect of unspecified anesthetic, initial encounter: T41.45XA

## 2016-12-21 HISTORY — DX: Personal history of urinary calculi: Z87.442

## 2016-12-21 HISTORY — DX: Other complications of anesthesia, initial encounter: T88.59XA

## 2016-12-21 NOTE — Telephone Encounter (Signed)
Notified pt of surgery scheduled with Dr Apolinar JunesBrandon on 12/25/16, pre-admit testing phone interview 12/21/16 between 1-5pm & to call day prior to surgery for arrival time to SDS. Pt voices understanding.

## 2016-12-21 NOTE — Patient Instructions (Signed)
  Your procedure is scheduled on: 12-25-16 Report to Same Day Surgery 2nd floor medical mall [facility](Medical Mall Entrance-take elevator on left to 2nd floor.  Check in with surgery information desk.) To find out your arrival time please call [phone] between 1PM - 3PM on 12-24-16  Remember: Instructions that are not followed completely may result in serious medical risk, up to and including death, or upon the discretion of your surgeon and anesthesiologist your surgery may need to be rescheduled.    _x___ 1. Do not eat food or drink liquids after midnight. No gum chewing or hard candies.     __x__ 2. No Alcohol for 24 hours before or after surgery.   __x__3. No Smoking for 24 prior to surgery.   ____  4. Bring all medications with you on the day of surgery if instructed.    __x__ 5. Notify your doctor if there is any change in your medical condition     (cold, fever, infections).     Do not wear jewelry, make-up, hairpins, clips or nail polish.  Do not wear lotions, powders, or perfumes. You may wear deodorant.  Do not shave 48 hours prior to surgery. Men may shave face and neck.  Do not bring valuables to the hospital.    [facility] not [facility] for any belongings or valuables.               Contacts, dentures or bridgework may not be worn into surgery.  Leave your suitcase in the car. After surgery it may be brought to your room.  For patients admitted to the hospital, discharge time is determined by your treatment team.   Patients discharged the day of surgery will not be allowed to drive home.  You will need someone to drive you home and stay with you the night of your procedure.    Please read over the following fact sheets that you were given:   [facility] Preparing for Surgery and or MRSA Information   ____ Take these medicines the morning of surgery with A SIP OF WATER:    1. MAY TAKE HYDROCODONE IF NEEDED AM OF SURGERY  2.  3.  4.  5.  6.  ____Fleets enema or  Magnesium Citrate as directed.   ____ Use CHG Soap or sage wipes as directed on instruction sheet   ____ Use inhalers on the day of surgery and bring to hospital day of surgery  ____ Stop metformin 2 days prior to surgery    ____ Take 1/2 of usual insulin dose the night before surgery and none on the morning of surgery.   ____ Stop Aspirin, Coumadin, Pllavix ,Eliquis, Effient, or Pradaxa  x__ Stop Anti-inflammatories such as Advil, Aleve, Ibuprofen, Motrin, NAPROXEN,          Naprosyn, Goodies powders or aspirin products NOW-Ok to take Tylenol.   ____ Stop supplements until after surgery.    ____ Bring C-Pap to the hospital.

## 2016-12-22 LAB — CULTURE, URINE COMPREHENSIVE

## 2016-12-24 MED ORDER — CEFAZOLIN SODIUM-DEXTROSE 2-4 GM/100ML-% IV SOLN
2.0000 g | INTRAVENOUS | Status: AC
  Administered 2016-12-25: 2 g via INTRAVENOUS

## 2016-12-25 ENCOUNTER — Ambulatory Visit: Payer: Federal, State, Local not specified - PPO | Admitting: Anesthesiology

## 2016-12-25 ENCOUNTER — Telehealth: Payer: Self-pay

## 2016-12-25 ENCOUNTER — Ambulatory Visit
Admission: RE | Admit: 2016-12-25 | Discharge: 2016-12-25 | Disposition: A | Discharge status: Home / Self Care | Payer: Federal, State, Local not specified - PPO | Source: Ambulatory Visit | Attending: Urology | Admitting: Urology

## 2016-12-25 ENCOUNTER — Encounter
Admission: RE | Disposition: A | Discharge status: Home / Self Care | Payer: Self-pay | Source: Ambulatory Visit | Attending: Urology

## 2016-12-25 DIAGNOSIS — R310 Gross hematuria: Secondary | ICD-10-CM | POA: Insufficient documentation

## 2016-12-25 DIAGNOSIS — Z87442 Personal history of urinary calculi: Secondary | ICD-10-CM | POA: Diagnosis not present

## 2016-12-25 DIAGNOSIS — N201 Calculus of ureter: Secondary | ICD-10-CM

## 2016-12-25 DIAGNOSIS — N132 Hydronephrosis with renal and ureteral calculous obstruction: Secondary | ICD-10-CM | POA: Diagnosis not present

## 2016-12-25 DIAGNOSIS — F17210 Nicotine dependence, cigarettes, uncomplicated: Secondary | ICD-10-CM | POA: Insufficient documentation

## 2016-12-25 HISTORY — PX: CYSTOSCOPY WITH STENT PLACEMENT: SHX5790

## 2016-12-25 HISTORY — PX: URETEROSCOPY WITH HOLMIUM LASER LITHOTRIPSY: SHX6645

## 2016-12-25 SURGERY — URETEROSCOPY, WITH LITHOTRIPSY USING HOLMIUM LASER
Anesthesia: General | Site: Ureter | Laterality: Left | Wound class: Clean Contaminated

## 2016-12-25 MED ORDER — HYDROCODONE-ACETAMINOPHEN 5-325 MG PO TABS
ORAL_TABLET | ORAL | Status: AC
  Filled 2016-12-25: qty 1

## 2016-12-25 MED ORDER — FENTANYL CITRATE (PF) 100 MCG/2ML IJ SOLN
INTRAMUSCULAR | Status: AC
  Filled 2016-12-25: qty 2

## 2016-12-25 MED ORDER — PROPOFOL 10 MG/ML IV BOLUS
INTRAVENOUS | Status: AC
  Filled 2016-12-25: qty 20

## 2016-12-25 MED ORDER — LIDOCAINE 2% (20 MG/ML) 5 ML SYRINGE
INTRAMUSCULAR | Status: AC
  Filled 2016-12-25: qty 5

## 2016-12-25 MED ORDER — CEFAZOLIN SODIUM-DEXTROSE 2-4 GM/100ML-% IV SOLN
INTRAVENOUS | Status: AC
  Filled 2016-12-25: qty 100

## 2016-12-25 MED ORDER — LIDOCAINE HCL (CARDIAC) 20 MG/ML IV SOLN
INTRAVENOUS | Status: DC | PRN
  Administered 2016-12-25: 50 mg via INTRAVENOUS

## 2016-12-25 MED ORDER — DEXAMETHASONE SODIUM PHOSPHATE 10 MG/ML IJ SOLN
INTRAMUSCULAR | Status: AC
  Filled 2016-12-25: qty 1

## 2016-12-25 MED ORDER — PROPOFOL 10 MG/ML IV BOLUS
INTRAVENOUS | Status: DC | PRN
Start: 2016-12-25 — End: 2016-12-25
  Administered 2016-12-25: 200 mg via INTRAVENOUS

## 2016-12-25 MED ORDER — FAMOTIDINE 20 MG PO TABS
ORAL_TABLET | ORAL | Status: AC
  Administered 2016-12-25: 20 mg via ORAL
  Filled 2016-12-25: qty 1

## 2016-12-25 MED ORDER — FAMOTIDINE 20 MG PO TABS
20.0000 mg | ORAL_TABLET | Freq: Once | ORAL | Status: AC
  Administered 2016-12-25: 20 mg via ORAL

## 2016-12-25 MED ORDER — ONDANSETRON HCL 4 MG/2ML IJ SOLN
INTRAMUSCULAR | Status: DC | PRN
  Administered 2016-12-25: 4 mg via INTRAVENOUS

## 2016-12-25 MED ORDER — KETOROLAC TROMETHAMINE 30 MG/ML IJ SOLN
INTRAMUSCULAR | Status: DC | PRN
  Administered 2016-12-25: 30 mg via INTRAVENOUS

## 2016-12-25 MED ORDER — DEXAMETHASONE SODIUM PHOSPHATE 10 MG/ML IJ SOLN
INTRAMUSCULAR | Status: DC | PRN
  Administered 2016-12-25: 5 mg via INTRAVENOUS

## 2016-12-25 MED ORDER — ROCURONIUM BROMIDE 100 MG/10ML IV SOLN
INTRAVENOUS | Status: DC | PRN
  Administered 2016-12-25: 40 mg via INTRAVENOUS
  Administered 2016-12-25: 10 mg via INTRAVENOUS

## 2016-12-25 MED ORDER — MIDAZOLAM HCL 2 MG/2ML IJ SOLN
INTRAMUSCULAR | Status: AC
  Filled 2016-12-25: qty 2

## 2016-12-25 MED ORDER — FENTANYL CITRATE (PF) 100 MCG/2ML IJ SOLN
25.0000 ug | INTRAMUSCULAR | Status: DC | PRN
  Administered 2016-12-25 (×4): 25 ug via INTRAVENOUS

## 2016-12-25 MED ORDER — OXYBUTYNIN CHLORIDE 5 MG PO TABS: 5.0000 mg | ORAL_TABLET | Freq: Three times a day (TID) | ORAL | 0 refills | Status: DC | PRN

## 2016-12-25 MED ORDER — FENTANYL CITRATE (PF) 100 MCG/2ML IJ SOLN
INTRAMUSCULAR | Status: AC
  Administered 2016-12-25: 25 ug via INTRAVENOUS
  Filled 2016-12-25: qty 2

## 2016-12-25 MED ORDER — SUGAMMADEX SODIUM 200 MG/2ML IV SOLN
INTRAVENOUS | Status: DC | PRN
  Administered 2016-12-25: 120 mg via INTRAVENOUS

## 2016-12-25 MED ORDER — ONDANSETRON HCL 4 MG/2ML IJ SOLN: 4.0000 mg | Freq: Once | INTRAMUSCULAR | Status: DC | PRN

## 2016-12-25 MED ORDER — HYDROCODONE-ACETAMINOPHEN 5-325 MG PO TABS: 1.0000 | ORAL_TABLET | ORAL | 0 refills | Status: DC | PRN

## 2016-12-25 MED ORDER — HYDROCODONE-ACETAMINOPHEN 5-325 MG PO TABS
1.0000 | ORAL_TABLET | ORAL | Status: DC | PRN
  Administered 2016-12-25: 1 via ORAL

## 2016-12-25 MED ORDER — ONDANSETRON HCL 4 MG/2ML IJ SOLN
INTRAMUSCULAR | Status: AC
  Filled 2016-12-25: qty 2

## 2016-12-25 MED ORDER — KETOROLAC TROMETHAMINE 30 MG/ML IJ SOLN
INTRAMUSCULAR | Status: AC
  Filled 2016-12-25: qty 1

## 2016-12-25 MED ORDER — DOCUSATE SODIUM 100 MG PO CAPS: 100.0000 mg | ORAL_CAPSULE | Freq: Two times a day (BID) | ORAL | 0 refills | Status: DC

## 2016-12-25 MED ORDER — FENTANYL CITRATE (PF) 100 MCG/2ML IJ SOLN
INTRAMUSCULAR | Status: DC | PRN
  Administered 2016-12-25: 100 ug via INTRAVENOUS
  Administered 2016-12-25: 50 ug via INTRAVENOUS

## 2016-12-25 MED ORDER — IOTHALAMATE MEGLUMINE 43 % IV SOLN
INTRAVENOUS | Status: DC | PRN
Start: 2016-12-25 — End: 2016-12-25
  Administered 2016-12-25: 7 mL

## 2016-12-25 MED ORDER — MIDAZOLAM HCL 2 MG/2ML IJ SOLN
INTRAMUSCULAR | Status: DC | PRN
  Administered 2016-12-25: 2 mg via INTRAVENOUS

## 2016-12-25 MED ORDER — SUGAMMADEX SODIUM 200 MG/2ML IV SOLN
INTRAVENOUS | Status: AC
  Filled 2016-12-25: qty 2

## 2016-12-25 MED ORDER — LACTATED RINGERS IV SOLN
INTRAVENOUS | Status: DC
  Administered 2016-12-25: 11:00:00 via INTRAVENOUS

## 2016-12-25 MED ORDER — ROCURONIUM BROMIDE 50 MG/5ML IV SOSY
PREFILLED_SYRINGE | INTRAVENOUS | Status: AC
  Filled 2016-12-25: qty 5

## 2016-12-25 SURGICAL SUPPLY — 34 items
ADHESIVE MASTISOL STRL (MISCELLANEOUS) ×2 IMPLANT
BAG DRAIN CYSTO-URO LG1000N (MISCELLANEOUS) ×2 IMPLANT
BASKET ZERO TIP 1.9FR (BASKET) ×2 IMPLANT
CATH URETL 5X70 OPEN END (CATHETERS) ×2 IMPLANT
CNTNR SPEC 2.5X3XGRAD LEK (MISCELLANEOUS) ×2
CONRAY 43 FOR UROLOGY 50M (MISCELLANEOUS) ×2 IMPLANT
CONT SPEC 4OZ STER OR WHT (MISCELLANEOUS) ×2
CONTAINER SPEC 2.5X3XGRAD LEK (MISCELLANEOUS) ×2 IMPLANT
DRAPE UTILITY 15X26 TOWEL STRL (DRAPES) ×2 IMPLANT
DRSG TEGADERM 2-3/8X2-3/4 SM (GAUZE/BANDAGES/DRESSINGS) ×2 IMPLANT
FIBER LASER LITHO 273 (Laser) ×2 IMPLANT
GLOVE BIO SURGEON STRL SZ 6.5 (GLOVE) ×2 IMPLANT
GOWN STRL REUS W/ TWL LRG LVL3 (GOWN DISPOSABLE) ×2 IMPLANT
GOWN STRL REUS W/ TWL LRG LVL4 (GOWN DISPOSABLE) ×2 IMPLANT
GOWN STRL REUS W/TWL LRG LVL3 (GOWN DISPOSABLE) ×2
GOWN STRL REUS W/TWL LRG LVL4 (GOWN DISPOSABLE) ×2
GUIDEWIRE GREEN .038 145CM (MISCELLANEOUS) ×2 IMPLANT
INFUSOR MANOMETER BAG 3000ML (MISCELLANEOUS) ×2 IMPLANT
INTRODUCER DILATOR DOUBLE (INTRODUCER) IMPLANT
KIT RM TURNOVER CYSTO AR (KITS) ×2 IMPLANT
PACK CYSTO AR (MISCELLANEOUS) ×2 IMPLANT
SCRUB POVIDONE IODINE 4 OZ (MISCELLANEOUS) ×2 IMPLANT
SENSORWIRE 0.038 NOT ANGLED (WIRE) ×2
SET CYSTO W/LG BORE CLAMP LF (SET/KITS/TRAYS/PACK) ×2 IMPLANT
SHEATH URETERAL 12FRX35CM (MISCELLANEOUS) IMPLANT
SOL .9 NS 3000ML IRR  AL (IV SOLUTION) ×1
SOL .9 NS 3000ML IRR UROMATIC (IV SOLUTION) ×1 IMPLANT
STENT URET 6FRX24 CONTOUR (STENTS) IMPLANT
STENT URET 6FRX26 CONTOUR (STENTS) IMPLANT
STENT URET 6FRX28 CONTOUR (STENTS) ×2 IMPLANT
SURGILUBE 2OZ TUBE FLIPTOP (MISCELLANEOUS) ×2 IMPLANT
SYRINGE IRR TOOMEY STRL 70CC (SYRINGE) ×2 IMPLANT
WATER STERILE IRR 1000ML POUR (IV SOLUTION) ×2 IMPLANT
WIRE SENSOR 0.038 NOT ANGLED (WIRE) ×1 IMPLANT

## 2016-12-25 NOTE — Interval H&P Note (Signed)
History and Physical Interval Note:  12/25/2016 11:51 AM  Dustin Butler  has presented today for surgery, with the diagnosis of left ureteral stone  The various methods of treatment have been discussed with the patient and family. After consideration of risks, benefits and other options for treatment, the patient has consented to  Procedure(s): URETEROSCOPY WITH HOLMIUM LASER LITHOTRIPSY (Left) CYSTOSCOPY WITH STENT PLACEMENT (Left) as a surgical intervention .  The patient's history has been reviewed, patient examined, no change in status, stable for surgery.  I have reviewed the patient's chart and labs.  Questions were answered to the patient's satisfaction.    RRR CTAB   [name]

## 2016-12-25 NOTE — Op Note (Signed)
Date of procedure: 12/25/16  Preoperative diagnosis:  1. Left mid ureteral stone   Postoperative diagnosis:  1. Same as above   Procedure: 1. Left ureteroscopy, laser lithotripsy 2. Left retrograde pyelogram 3. Left ureteral stent placement 4. Basket extraction of Stone fragment  Surgeon: Vanna ScotlandAshley Hilja Kintzel, MD  Anesthesia: General  Complications: None  Intraoperative findings: 6 mm stone at the mid left ureter. Small fragment and left lower pole basketed.  EBL: Minimal  Drains: 6 x 28 French double-J ureteral stent on left, string left in place  Specimen: Punctate stone fragment  Indication: Dustin Butler is a 21 y.o. patient with 6 mm proximal/mid ureteral stone on left.  After reviewing the management options for treatment, he elected to proceed with the above surgical procedure(s). We have discussed the potential benefits and risks of the procedure, side effects of the proposed treatment, the likelihood of the patient achieving the goals of the procedure, and any potential problems that might occur during the procedure or recuperation. Informed consent has been obtained.  Description of procedure:  The patient was taken to the operating room and general anesthesia was induced.  The patient was placed in the dorsal lithotomy position, prepped and draped in the usual sterile fashion, and preoperative antibiotics were administered. A preoperative time-out was performed.   21 [place] scope was advanced per urethra into the bladder. The bladder was unremarkable. Attention was turned to the left ureteral orifice which was cannulated using a 5 [place] open-ended ureteral catheter just within the UO. A gentle retrograde pyelogram was performed which showed no obvious filling defect. No clear stone couldn't be seen on fluoroscopy. The upper tract collecting system was decompressed without filling defects. Given the patient had pain just this morning, retained stone was highly suspected. A  sensor wire was then placed up to level of the kidney without difficulty. This was snapped in place as a safety wire. A semirigid ureteroscope was then advanced up the ureter without difficulty to the mid ureter where the stone was identified.  A 273  laser fiber was then brought in and using settings of 0.8 J and 10 Hz, the stone was fragmented into tiny pieces. Each of these pieces were then basketed using a 1.9 [place] to plus nitinol basket.  The scope was then advanced to the proximal ureter but not able to be advanced much beyond the stone level. Given concern for possible reflux stone into the kidney, a Super Stiff wire was then placed. A flexible 7 French ureteroscope was advanced up to level of the kidney without difficulty under fluoroscopic guidance. Formal pyeloscopy was then performed. Each and every calyx was strictly visualized. A small stone was identified in the left lower pole which was mostly fragmented using the laser fiber. The largest piece was then extracted via nitinol basket. Upon retracting the scope, careful attention was paid to the ureter to ensure that there was no significant injury to the ureter or retained ureteral stones. There was some mild edema where the stone at the previously located, otherwise unremarkable. The scope was then removed. The safety wire was then backloaded over a rigid cystoscope. A 6 x 28 French double-J ureteral stent was advanced over the wire up to level of the kidney. The wire was partially drawn until full coil stone in the renal pelvis. Wire was then fully withdrawn and a full coil was noted within the bladder. The string was left attached the stent. The bladder was then drained. The stent string was  affixed to the patient's glans using Mastisol and Tegaderm. He was then cleaned and dried, repositioned supine position, reversed anesthesia, taken the PACU in stable condition.  Plan: Patient will follow-up in 4 weeks with a renal ultrasound prior. He will  remove his own stent on Friday.  Vanna Scotland, M.D.

## 2016-12-25 NOTE — Transfer of Care (Signed)
Immediate Anesthesia Transfer of Care Note  Patient: Dustin AldermanJamison C Averitt  Procedure(s) Performed: Procedure(s): URETEROSCOPY WITH HOLMIUM LASER LITHOTRIPSY (Left) CYSTOSCOPY WITH STENT PLACEMENT (Left)  Patient Location: PACU  Anesthesia Type:General  Level of Consciousness: patient cooperative and lethargic  Airway & Oxygen Therapy: Patient Spontanous Breathing and Patient connected to face mask oxygen  Post-op Assessment: Report given to RN and Post -op Vital signs reviewed and stable  Post vital signs: Reviewed and stable  Last Vitals:  Vitals:   12/25/16 1039 12/25/16 1315  BP: 108/71 127/74  Pulse: 89 81  Resp: 20 20  Temp: 36.8 C 36.4 C    Last Pain:  Vitals:   12/25/16 1039  TempSrc: Oral  PainSc: 6          Complications: No apparent anesthesia complications

## 2016-12-25 NOTE — Anesthesia Preprocedure Evaluation (Signed)
Anesthesia Evaluation  Patient identified by MRN, date of birth, ID band Patient awake    Reviewed: Allergy & Precautions, H&P , NPO status , Patient's Chart, lab work & pertinent test results, reviewed documented beta blocker date and time   History of Anesthesia Complications (+) PROLONGED EMERGENCE and history of anesthetic complications  Airway Mallampati: II  TM Distance: >3 FB Neck ROM: full    Dental  (+) Teeth Intact   Pulmonary neg pulmonary ROS, Current Smoker,    Pulmonary exam normal        Cardiovascular negative cardio ROS Normal cardiovascular exam Rhythm:regular Rate:Normal     Neuro/Psych  Headaches, negative neurological ROS  negative psych ROS   GI/Hepatic negative GI ROS, Neg liver ROS, GERD  Medicated,  Endo/Other  negative endocrine ROS  Renal/GU negative Renal ROS  negative genitourinary   Musculoskeletal   Abdominal   Peds  Hematology negative hematology ROS (+) anemia ,   Anesthesia Other Findings Past Medical History: No date: Abdominal cramps No date: Anemia No date: BRBPR (bright red blood per rectum) No date: Complication of anesthesia     Comment: WAS GIVEN TOO MUCH ANESTHESIA FOR HIS WISDOM               TEETH AND WAS HARD TO WAKE UP No date: GERD (gastroesophageal reflux disease) No date: Hematuria No date: History of kidney stones Past Surgical History: 10/24/2015: COLONOSCOPY WITH PROPOFOL N/A     Comment: Procedure: COLONOSCOPY WITH PROPOFOL;                Surgeon: Wallace CullensPaul Y Oh, MD;  Location: [facility]               ENDOSCOPY;  Service: Gastroenterology;                Laterality: N/A; No date: ELBOW SURGERY No date: spider bite No date: WISDOM TOOTH EXTRACTION   Reproductive/Obstetrics negative OB ROS                             Anesthesia Physical Anesthesia Plan  ASA: II  Anesthesia Plan: General ETT   Post-op Pain Management:     Induction:   Airway Management Planned:   Additional Equipment:   Intra-op Plan:   Post-operative Plan:   Informed Consent: I have reviewed the patients History and Physical, chart, labs and discussed the procedure including the risks, benefits and alternatives for the proposed anesthesia with the patient or authorized representative who has indicated his/her understanding and acceptance.   Dental Advisory Given  Plan Discussed with: CRNA  Anesthesia Plan Comments:         Anesthesia Quick Evaluation

## 2016-12-25 NOTE — Anesthesia Procedure Notes (Signed)
Procedure Name: Intubation Performed by: Tejas Seawood Pre-anesthesia Checklist: Patient identified, Patient being monitored, Timeout performed, Emergency Drugs available and Suction available Patient Re-evaluated:Patient Re-evaluated prior to inductionOxygen Delivery Method: Circle system utilized Preoxygenation: Pre-oxygenation with 100% oxygen Intubation Type: IV induction Ventilation: Mask ventilation without difficulty Laryngoscope Size: Miller and 2 Grade View: Grade II Tube type: Oral Tube size: 7.5 mm Number of attempts: 1 Airway Equipment and Method: Stylet Placement Confirmation: ETT inserted through vocal cords under direct vision,  positive ETCO2 and breath sounds checked- equal and bilateral Secured at: 22 cm Tube secured with: Tape Dental Injury: Teeth and Oropharynx as per pre-operative assessment        

## 2016-12-25 NOTE — Discharge Instructions (Signed)
AMBULATORY SURGERY  DISCHARGE INSTRUCTIONS   1) The drugs that you were given will stay in your system until tomorrow so for the next 24 hours you should not:  A) Drive an automobile B) Make any legal decisions C) Drink any alcoholic beverage   2) You may resume regular meals tomorrow.  Today it is better to start with liquids and gradually work up to solid foods.  You may eat anything you prefer, but it is better to start with liquids, then soup and crackers, and gradually work up to solid foods.   3) Please notify your doctor immediately if you have any unusual bleeding, trouble breathing, redness and pain at the surgery site, drainage, fever, or pain not relieved by medication. 4)   5) Your post-operative visit with Dr.                                     is: Date:                        Time:    Please call to schedule your post-operative visit.  6) Additional Instructions:     You have a ureteral stent in place.  This is a tube that extends from your kidney to your bladder.  This may cause urinary bleeding, burning with urination, and urinary frequency.  Please call our office or present to the ED if you develop fevers >101 or pain which is not able to be controlled with oral pain medications.  You may be given either Flomax and/ or ditropan to help with bladder spasms and stent pain in addition to pain medications.    Your stent is on a string. This is taped to your penis. On Friday morning, untaped the stent and pulled gently until the entire  string and stent is removed. You may have some crampy pain which comes and goes for up to 24 hours.  [facility] [address], [address] [place], [place] [id number] [phone]

## 2016-12-25 NOTE — Anesthesia Post-op Follow-up Note (Cosign Needed)
Anesthesia QCDR form completed.        

## 2016-12-25 NOTE — Telephone Encounter (Signed)
f/u 4 weeks with RUS prior with Carollee HerterShannon

## 2016-12-25 NOTE — H&P (View-Only) (Signed)
  12/17/2016 4:20 PM   Dustin Butler 11/09/1996 [id number]  Referring provider: Kanhka Linthavong, MD [address] [facility] [place], [place] [address]  Chief Complaint  Patient presents with  . New Patient (Initial Visit)    kidney stone referred by the ER    HPI: Patient is a 20 year old Caucasian male who presents/is referred by [facility]'s ED for nephrolithiasis.  Patient states the onset of the pain was two weeks ago.   It was sharp.  It lasted for several hours off and on over the last two weeks.  He had gross hematuria associated with the stone,  He has had nausea and vomiting.  He has not had any fevers or chills.  .  The pain was located lower back and radiated to the  and radiated to left flank.  The pain was a 10/10.  Nothing made the pain better.   Nothing made the pain worse.  In the ED, he received Vicodin, Toradol, naprosyn, Zofran and tamsulosin.  His UA demonstrated TNTC RBC's and 6-30 WBC's.  .  Serum creatinine 1.09.  His WBC count was 16.5.    CT Renal stone study was performed on 12/14/2016 noted since the previous study, the patient has developed renal stone disease bilaterally in a pattern suggesting medullary sponge kidney with multiple tiny medullary calcifications bilaterally. On the left, there are 3 slightly larger nonobstructing stones in the kidney measuring about 2 mm in size. There is a 5 mm stone in the left ureter at the L3 level with mild to moderate left hydroureteronephrosis. This has somewhat angular margins.  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  I have independently reviewed the films.     Today, his pain is in his lower stomach.  3/10 pain at this time.  8/10 this morning before taking a Vicodin.  He is taking tamsulosin today.  His has been pushing his fluids.  He has not any recent fevers or chills.  He has not had nausea or vomiting.   UA demonstrates > 30 RBC's.    He does not have a prior  history of stones.    PMH: Past Medical History:  Diagnosis Date  . Abdominal cramps   . Anemia   . BRBPR (bright red blood per rectum)   . GERD (gastroesophageal reflux disease)   . Hematuria     Surgical History: Past Surgical History:  Procedure Laterality Date  . COLONOSCOPY WITH PROPOFOL N/A 10/24/2015   Procedure: COLONOSCOPY WITH PROPOFOL;  Surgeon: Paul Y Oh, MD;  Location: [facility] ENDOSCOPY;  Service: Gastroenterology;  Laterality: N/A;  . spider bite      Home Medications:  Allergies as of 12/17/2016   No Known Allergies     Medication List       Accurate as of 12/17/16  4:20 PM. Always use your most recent med list.          HYDROcodone-acetaminophen 5-325 MG tablet Commonly known as:  NORCO/VICODIN Take 1 tablet by mouth every 4 (four) hours as needed for moderate pain.   naproxen 500 MG tablet Commonly known as:  NAPROSYN Take 1 tablet (500 mg total) by mouth 2 (two) times daily with a meal.   ondansetron 4 MG disintegrating tablet Commonly known as:  ZOFRAN ODT Take 1 tablet (4 mg total) by mouth every 8 (eight) hours as needed for nausea or vomiting.   tamsulosin 0.4 MG Caps capsule Commonly known as:    FLOMAX Take 1 capsule (0.4 mg total) by mouth daily.       Allergies: No Known Allergies  Family History: Family History  Problem Relation Age of Onset  . Prostate cancer Father   . Kidney cancer Neg Hx   . Bladder Cancer Neg Hx     Social History:  reports that he has been smoking Cigarettes.  He has been smoking about 1.00 pack per day. He has quit using smokeless tobacco. His smokeless tobacco use included Chew. He reports that he drinks alcohol. He reports that he does not use drugs.  ROS: UROLOGY Frequent Urination?: Yes Hard to postpone urination?: Yes Burning/pain with urination?: No Get up at night to urinate?: No Leakage of urine?: Yes Urine stream starts and stops?: No Trouble starting stream?: No Do you have to strain to  urinate?: No Blood in urine?: Yes Urinary tract infection?: No Sexually transmitted disease?: No Injury to kidneys or bladder?: No Painful intercourse?: No Weak stream?: No Erection problems?: No Penile pain?: No  Gastrointestinal Nausea?: No Vomiting?: No Indigestion/heartburn?: No Diarrhea?: No Constipation?: No  Constitutional Fever: No Night sweats?: No Weight loss?: No Fatigue?: No  Skin Skin rash/lesions?: No Itching?: No  Eyes Blurred vision?: No Double vision?: No  Ears/Nose/Throat Sore throat?: No Sinus problems?: No  Hematologic/Lymphatic Swollen glands?: No Easy bruising?: No  Cardiovascular Leg swelling?: No Chest pain?: No  Respiratory Cough?: No Shortness of breath?: No  Endocrine Excessive thirst?: No  Musculoskeletal Back pain?: Yes Joint pain?: No  Neurological Headaches?: Yes Dizziness?: No  Psychologic Depression?: No Anxiety?: No  Physical Exam: BP 104/65   Pulse 84   Ht 6' (1.829 m)   Wt 128 lb 4.8 oz (58.2 kg)   BMI 17.40 kg/m   Constitutional: Well nourished. Alert and oriented, No acute distress. HEENT: [place] AT, moist mucus membranes. Trachea midline, no masses. Cardiovascular: No clubbing, cyanosis, or edema. Respiratory: Normal respiratory effort, no increased work of breathing. GI: Abdomen is soft, non tender, non distended, no abdominal masses. Liver and spleen not palpable.  No hernias appreciated.  Stool sample for occult testing is not indicated.   GU: No CVA tenderness.  No bladder fullness or masses.   Skin: No rashes, bruises or suspicious lesions. Lymph: No cervical or inguinal adenopathy. Neurologic: Grossly intact, no focal deficits, moving all 4 extremities. Psychiatric: Normal mood and affect.  Laboratory Data: Lab Results  Component Value Date   WBC 16.5 (H) 12/14/2016   HGB 14.6 12/14/2016   HCT 42.5 12/14/2016   MCV 95.0 12/14/2016   PLT 195 12/14/2016    Lab Results  Component Value  Date   CREATININE 1.09 12/14/2016     Lab Results  Component Value Date   AST 24 12/14/2016   Lab Results  Component Value Date   ALT 14 (L) 12/14/2016   Urinalysis > 30 RBC's.  See EPIC.    Pertinent Imaging: CLINICAL DATA:  Left-sided flank pain and hematuria, 2 days duration.  EXAM: CT ABDOMEN AND PELVIS WITHOUT CONTRAST  TECHNIQUE: Multidetector CT imaging of the abdomen and pelvis was performed following the standard protocol without IV contrast.  COMPARISON:  10/11/2015  FINDINGS: Lower chest: Normal  Hepatobiliary: Normal.  No calcified gallstones.  Pancreas: Normal  Spleen: Normal  Adrenals/Urinary Tract: The adrenal glands are normal. Right kidney contains tiny calcifications in the medullary regions possibly indicating medullary sponge kidney. Left kidney shows a similar finding with the re- slightly larger focal stones, 2 mm in diameter, 1  in the upper pole, 1 in the midportion and 1 in the lower pole. 1 cm cyst in the lower medial portion of the kidney and in the lateral midportion of the kidney. Mild to moderate hydroureteronephrosis on the left secondary to a 5 mm stone in the ureter at the L3 level. This has somewhat angular configurations. No stone in the bladder.  Stomach/Bowel: Normal  Vascular/Lymphatic: Normal  Reproductive: Normal  Other: No free fluid or air  Musculoskeletal: Chronic appearing bilateral pars defects at L5 with 3 mm of anterolisthesis.  IMPRESSION: Since the previous study, the patient has developed renal stone disease bilaterally in a pattern suggesting medullary sponge kidney with multiple tiny medullary calcifications bilaterally. On the left, there are 3 slightly larger nonobstructing stones in the kidney measuring about 2 mm in size. There is a 5 mm stone in the left ureter at the L3 level with mild to moderate left hydroureteronephrosis. This has somewhat angular margins.  Chronic bilateral  pars defects at L5 with 3 mm of anterolisthesis.   Electronically Signed   By: Mark  Shogry M.D.   On: 12/14/2016 14:58  Assessment & Plan:    Patient will undergo a left ureteroscopy with laser lithotripsy with ureteral stent placement.    1. Left ureteral stone  - 5 mm mid left ureteral stone  - discussed MET vs ESWL vs URS/LL/stent placement  - patient would like to undergo URS/LL/stent placement  - schedule LEFT ureteroscopy with laser lithotripsy and ureteral stent placement  - explained to the patient how the procedure is performed and the risks involved  - informed patient that they will have a stent placed during the procedure and will remain in place after the procedure for a short time.   - stent may be removed in the office with a cystoscope or patient may be instructed to remove the stent themselves by the string  - described "stent pain" as feelings of needing to urinate/overactive bladder and a warm, tingling sensation to intense pain in the affected flank  - residual stones within the kidney or ureter may be present after the procedure and may need to have these addressed at a different encounter  - injury to the ureter is the most common intra-operative risk, it may result in an open procedure to correct the defect  - infection and bleeding are also risks  - explained the risks of general anesthesia, such as: MI, CVA, paralysis, coma and/or death.  - UA  - urine culture  - advised to contact our office or seek treatment in the ED if becomes febrile or pain/ vomiting are difficult control in order to arrange for emergent/urgent intervention  2. Bilateral renal stones/medullary sponge kidney  - advise 24 hour once obstructing stone is removed  - explained to the patient that may or may not be able to address the left renal stones during the scheduled URS/LL/ureteral stent  3. Left hydronephrosis  - obtain RUS after obstructing stone is removed  4. Gross  hematuria  - We will continue to monitor the patient's UA after the treatment/passage of the stone to ensure the hematuria has resolved.  If hematuria persists, we will pursue a hematuria workup with CT Urogram and cystoscopy if appropriate.    Return for left URS/LL/ureteral stent.  These notes generated with voice recognition software. I apologize for typographical errors.  Arrian Manson, PA-C  [facility] [address], [address] , [place] [address] [phone]  

## 2016-12-25 NOTE — Anesthesia Postprocedure Evaluation (Signed)
Anesthesia Post Note  Patient: Dustin AldermanJamison C Butler  Procedure(s) Performed: Procedure(s) (LRB): URETEROSCOPY WITH HOLMIUM LASER LITHOTRIPSY (Left) CYSTOSCOPY WITH STENT PLACEMENT (Left)  Patient location during evaluation: PACU Anesthesia Type: General Level of consciousness: awake and alert Pain management: pain level controlled Vital Signs Assessment: post-procedure vital signs reviewed and stable Respiratory status: spontaneous breathing, nonlabored ventilation, respiratory function stable and patient connected to nasal cannula oxygen Cardiovascular status: blood pressure returned to baseline and stable Postop Assessment: no signs of nausea or vomiting Anesthetic complications: no     Last Vitals:  Vitals:   12/25/16 1039 12/25/16 1315  BP: 108/71 127/74  Pulse: 89 81  Resp: 20 20  Temp: 36.8 C 36.4 C    Last Pain:  Vitals:   12/25/16 1330  TempSrc:   PainSc: 5                  Yevette EdwardsJames G Adams

## 2016-12-26 ENCOUNTER — Encounter: Payer: Self-pay | Admitting: Urology

## 2016-12-28 ENCOUNTER — Ambulatory Visit: Payer: Federal, State, Local not specified - PPO

## 2016-12-28 DIAGNOSIS — N200 Calculus of kidney: Secondary | ICD-10-CM

## 2016-12-28 NOTE — Progress Notes (Signed)
Pt presented today for stent removal. Pt tolerated well. No s/s of adverse reaction noted.  

## 2017-01-01 LAB — STONE ANALYSIS
Ca Oxalate,Dihydrate: 70 %
Ca Oxalate,Monohydr.: 20 %
Ca phos cry stone ql IR: 10 %
STONE WEIGHT KSTONE: 1.8 mg

## 2017-01-18 ENCOUNTER — Ambulatory Visit
Admission: RE | Admit: 2017-01-18 | Discharge: 2017-01-18 | Disposition: A | Discharge status: Home / Self Care | Payer: Federal, State, Local not specified - PPO | Source: Ambulatory Visit | Attending: Urology | Admitting: Urology

## 2017-01-18 DIAGNOSIS — N201 Calculus of ureter: Secondary | ICD-10-CM | POA: Diagnosis present

## 2017-01-18 DIAGNOSIS — N281 Cyst of kidney, acquired: Secondary | ICD-10-CM | POA: Insufficient documentation

## 2017-01-22 ENCOUNTER — Ambulatory Visit: Payer: Federal, State, Local not specified - PPO | Admitting: Urology

## 2017-01-22 NOTE — Progress Notes (Deleted)
01/22/2017 1:14 PM   Dustin AldermanJamison C Butler 12/13/1995 [id number]  Referring provider: Marisue IvanKanhka Linthavong, MD [phone] [facility] [place], [place] [id number]  No chief complaint on file.   HPI: 21 yo WM who is s/p left URS/LL/ureteral stent placement on 12/25/2016 and subsequent self stent removal who presents today for a one month follow up RUS.    Background history Patient is a 21 year old Caucasian male who [facility]ith[facility].  CT Renal stone study was performed on 12/14/2016 noted since the previous study, the patient has developed renal stone disease bilaterally in a pattern suggesting medullary sponge kidney with multiple tiny medullary calcifications bilaterally. On the left, there are 3 slightly larger nonobstructing stones in the kidney measuring about 2 mm in size. There is a 5 mm stone in the left ureter at the L3 level with mild to moderate left hydroureteronephrosis. This has somewhat angular margins.  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    He does not have a prior history of stones.    His post operative course was as expected and uneventful.  RUS performed on 01/18/2017 demonstrated no evidence of hydronephrosis. This likely reflects interval passage of the previously noted left ureteral stone.  Small left renal cysts, one of which is mildly complex. Given their appearance, these are likely benign.   I have independently reviewed the films.  Stone composition was 70% CaOx Dihydrate,  20% CaOx Monohydrate and 10% CaPhos.    PMH: Past Medical History:  Diagnosis Date  . Abdominal cramps   . Anemia   . BRBPR (bright red blood per rectum)   . Complication of anesthesia    WAS GIVEN TOO MUCH ANESTHESIA FOR HIS WISDOM TEETH AND WAS HARD TO WAKE UP  . GERD (gastroesophageal reflux disease)   . Hematuria   . History of kidney stones     Surgical History: Past Surgical History:  Procedure  Laterality Date  . COLONOSCOPY WITH PROPOFOL N/A 10/24/2015   Procedure: COLONOSCOPY WITH PROPOFOL;  Surgeon: Wallace CullensPaul Y Oh, MD;  [facility];  Service: Gastroenterology;  Laterality: N/A;  . CYSTOSCOPY WITH STENT PLACEMENT Left 12/25/2016   Procedure: CYSTOSCOPY WITH STENT PLACEMENT;  Surgeon: Vanna ScotlandAshley Brandon, MD;  Location: [facility] ORS;  Service: Urology;  Laterality: Left;  . ELBOW SURGERY    . spider bite    . URETEROSCOPY WITH HOLMIUM LASER LITHOTRIPSY Left 12/25/2016   Procedure: URETEROSCOPY WITH HOLMIUM LASER LITHOTRIPSY;  Surgeon: Vanna ScotlandAshley Brandon, MD;  Location: [facility] ORS;  Service: Urology;  Laterality: Left;  . WISDOM TOOTH EXTRACTION      Home Medications:  Allergies as of 01/22/2017   No Known Allergies     Medication List       Accurate as of 01/22/17  1:14 PM. Always use your most recent med list.          docusate sodium 100 MG capsule Commonly known as:  COLACE Take 1 capsule (100 mg total) by mouth 2 (two) times daily.   HYDROcodone-acetaminophen 5-325 MG tablet Commonly known as:  NORCO/VICODIN Take 1 tablet by mouth every 4 (four) hours as needed for moderate pain.   naproxen 500 MG tablet Commonly known as:  NAPROSYN Take 1 tablet (500 mg total) by mouth 2 (two) times daily with a meal.   ondansetron 4 MG disintegrating tablet Commonly known as:  ZOFRAN ODT Take 1 tablet (4 mg total) by mouth every 8 (  eight) hours as needed for nausea or vomiting.   oxybutynin 5 MG tablet Commonly known as:  DITROPAN Take 1 tablet (5 mg total) by mouth every 8 (eight) hours as needed for bladder spasms.   tamsulosin 0.4 MG Caps capsule Commonly known as:  FLOMAX Take 1 capsule (0.4 mg total) by mouth daily.       Allergies: No Known Allergies  Family History: Family History  Problem Relation Age of Onset  . Prostate cancer Father   . Kidney cancer Neg Hx   . Bladder Cancer Neg Hx     Social History:  reports that he has been smoking Cigarettes.  He has  a 2.00 pack-year smoking history. He has quit using smokeless tobacco. His smokeless tobacco use included Chew. He reports that he drinks alcohol. He reports that he does not use drugs.  ROS:                                        Physical Exam: There were no vitals taken for this visit.  Constitutional: Well nourished. Alert and oriented, No acute distress. HEENT: [place] AT, moist mucus membranes. Trachea midline, no masses. Cardiovascular: No clubbing, cyanosis, or edema. Respiratory: Normal respiratory effort, no increased work of breathing. GI: Abdomen is soft, non tender, non distended, no abdominal masses. Liver and spleen not palpable.  No hernias appreciated.  Stool sample for occult testing is not indicated.   GU: No CVA tenderness.  No bladder fullness or masses.   Skin: No rashes, bruises or suspicious lesions. Lymph: No cervical or inguinal adenopathy. Neurologic: Grossly intact, no focal deficits, moving all 4 extremities. Psychiatric: Normal mood and affect.  Laboratory Data: Lab Results  Component Value Date   WBC 16.5 (H) 12/14/2016   HGB 14.6 12/14/2016   HCT 42.5 12/14/2016   MCV 95.0 12/14/2016   PLT 195 12/14/2016    Lab Results  Component Value Date   CREATININE 1.09 12/14/2016     Lab Results  Component Value Date   AST 24 12/14/2016   Lab Results  Component Value Date   ALT 14 (L) 12/14/2016   Urinalysis ***  See EPIC.    Pertinent Imaging: CLINICAL DATA:  Follow-up left ureteral stone. Ureteroscopy several weeks ago.  EXAM: RENAL / URINARY TRACT ULTRASOUND COMPLETE  COMPARISON:  CT of the abdomen and pelvis from 12/14/2016  FINDINGS: Right Kidney:  Length: 10.9 cm. Echogenicity within normal limits. No mass or hydronephrosis visualized.  Left Kidney:  Length: 11.3 cm. Echogenicity within normal limits. Small left renal cysts are seen, one of which demonstrates a thin septation, measuring 1.1 cm and 0.8  cm. Given their appearance, these are likely benign. No hydronephrosis visualized.  Bladder:  Appears normal for degree of bladder distention.  IMPRESSION: 1. No evidence of hydronephrosis. This likely reflects interval passage of the previously noted left ureteral stone. 2. Small left renal cysts, one of which is mildly complex. Given their appearance, these are likely benign.   Electronically Signed   By: Roanna Raider M.D.   On: 01/18/2017 16:18  Assessment & Plan:    1. Left ureteral stone  - 5 mm mid left ureteral stone -s/p URS/LL/stent placement and self stent removal  - stone composition noted above  - encouraged 2.5 L of water daily, increase citrate in the diet, avoid salt and reduce protein intake  2. Bilateral renal stones/medullary  sponge kidney  - advise 24 hour once obstructing stone is removed    3. Left hydronephrosis  - obtain RUS after obstructing stone is removed  4. Gross hematuria  - We will continue to monitor the patient's UA after the treatment/passage of the stone to ensure the hematuria has resolved.  If hematuria persists, we will pursue a hematuria workup with CT Urogram and cystoscopy if appropriate.    No Follow-up on file.  These notes generated with voice recognition software. I apologize for typographical errors.  Michiel Cowboy, PA-C  [facility] [address] [place], [place] [address] [phone]

## 2017-03-08 ENCOUNTER — Encounter: Payer: Self-pay | Admitting: Urology

## 2017-11-14 ENCOUNTER — Other Ambulatory Visit: Payer: Self-pay | Admitting: Student

## 2017-11-14 DIAGNOSIS — R1013 Epigastric pain: Secondary | ICD-10-CM

## 2017-11-14 DIAGNOSIS — R1111 Vomiting without nausea: Secondary | ICD-10-CM

## 2017-11-20 ENCOUNTER — Ambulatory Visit
Admission: RE | Admit: 2017-11-20 | Discharge: 2017-11-20 | Disposition: A | Discharge status: Home / Self Care | Payer: Federal, State, Local not specified - PPO | Source: Ambulatory Visit | Attending: Student | Admitting: Student

## 2017-11-20 DIAGNOSIS — R1111 Vomiting without nausea: Secondary | ICD-10-CM | POA: Diagnosis not present

## 2017-11-20 DIAGNOSIS — R1013 Epigastric pain: Secondary | ICD-10-CM | POA: Diagnosis not present

## 2017-11-20 DIAGNOSIS — K219 Gastro-esophageal reflux disease without esophagitis: Secondary | ICD-10-CM | POA: Insufficient documentation

## 2018-06-12 ENCOUNTER — Ambulatory Visit
Admission: RE | Admit: 2018-06-12 | Discharge: 2018-06-12 | Disposition: A | Discharge status: Home / Self Care | Payer: Federal, State, Local not specified - PPO | Source: Ambulatory Visit | Attending: Family Medicine | Admitting: Family Medicine

## 2018-06-12 ENCOUNTER — Other Ambulatory Visit: Payer: Self-pay | Admitting: Family Medicine

## 2018-06-12 DIAGNOSIS — R9089 Other abnormal findings on diagnostic imaging of central nervous system: Secondary | ICD-10-CM | POA: Insufficient documentation

## 2018-06-12 DIAGNOSIS — G44311 Acute post-traumatic headache, intractable: Secondary | ICD-10-CM | POA: Diagnosis present

## 2020-04-15 IMAGING — CT CT HEAD W/O CM
3 of 4 series · 14 of 47 positions shown, 16 images · non-contrast
Comparison: 04/13/2013

CLINICAL DATA: Posterior headache and photophobia. Fall from a 10
foot ladder on 06/10/2018.

EXAM:
CT HEAD WITHOUT CONTRAST
TECHNIQUE: Contiguous axial images were obtained from the base of the skull
through the vertex without intravenous contrast.

[Series 4: head · axial · 0.37mm/px · z∈[-576,-456]mm · 8 of 76 slices shown, 10 images (1 of 3)]
[im 8/76  brain]
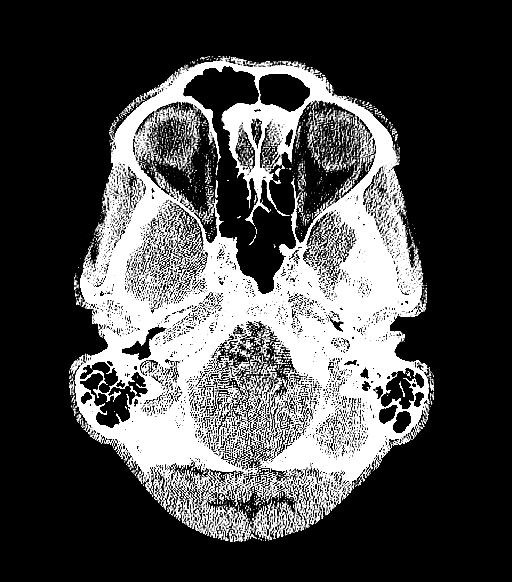
[im 8/76  bone]
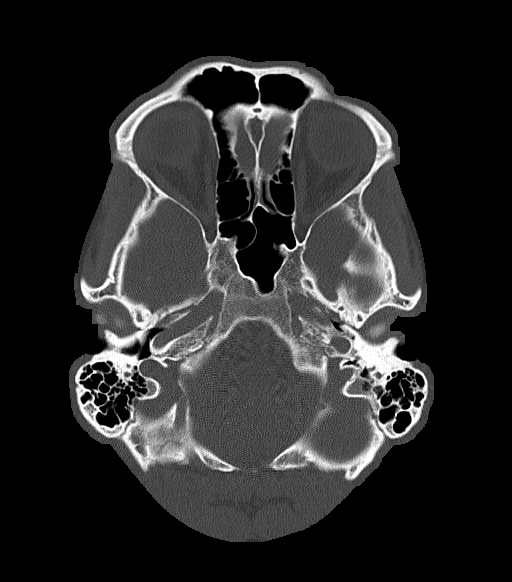
[im 15/76  brain]
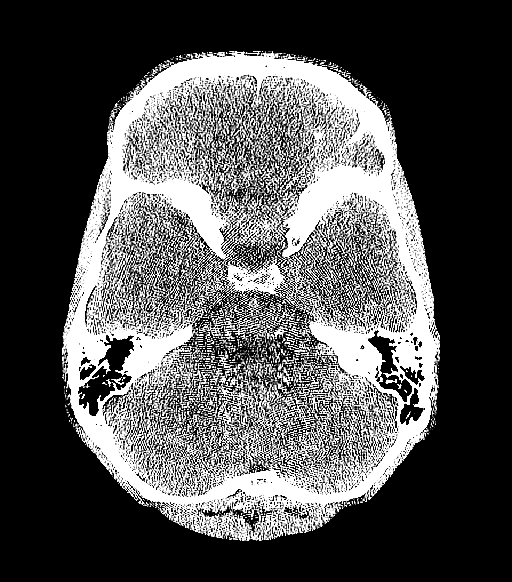
[im 26/76  brain]
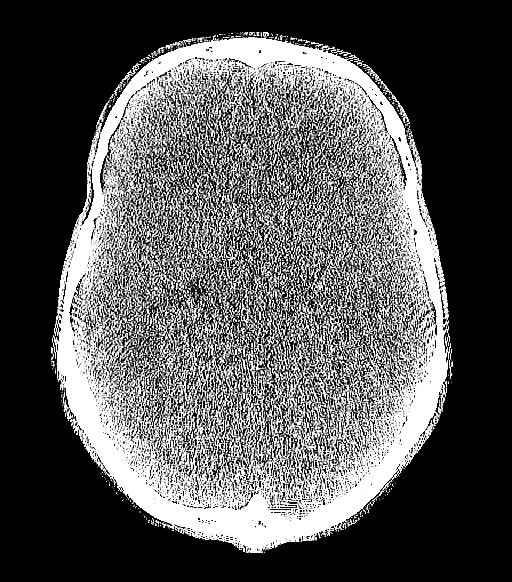
[im 33/76  brain]
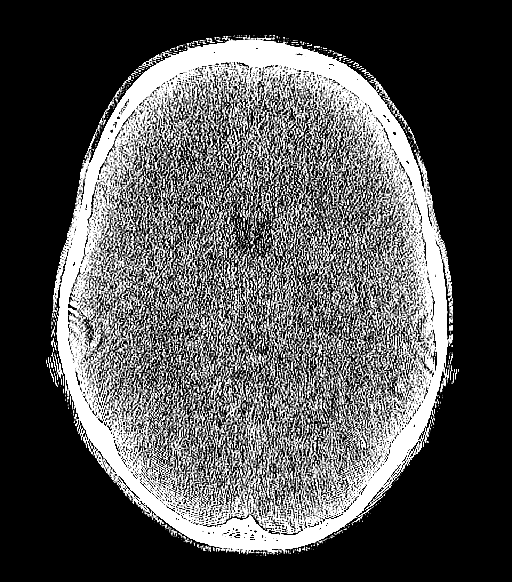
[im 43/76  brain]
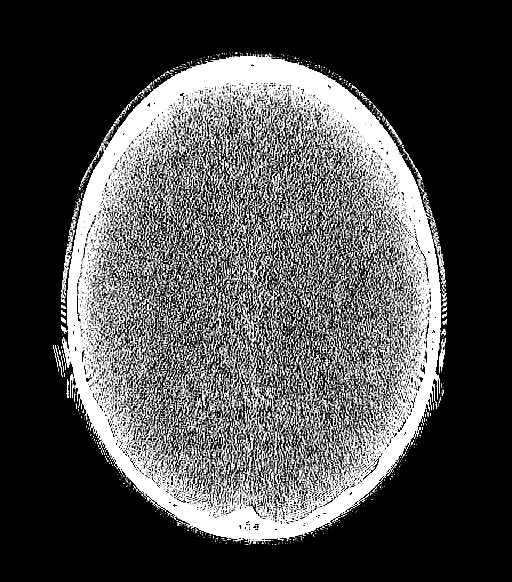
[im 43/76  bone]
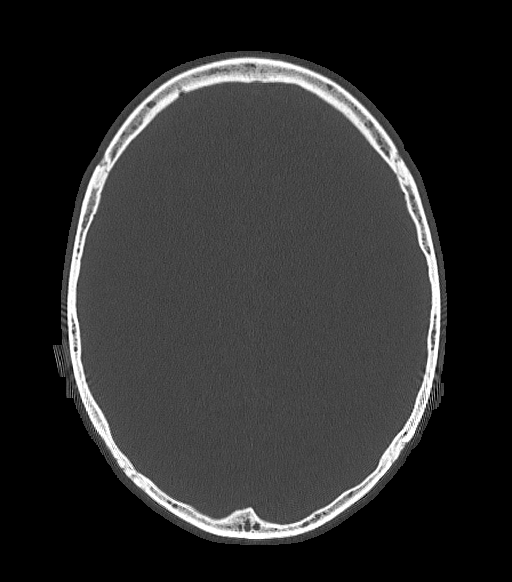
[im 51/76  brain]
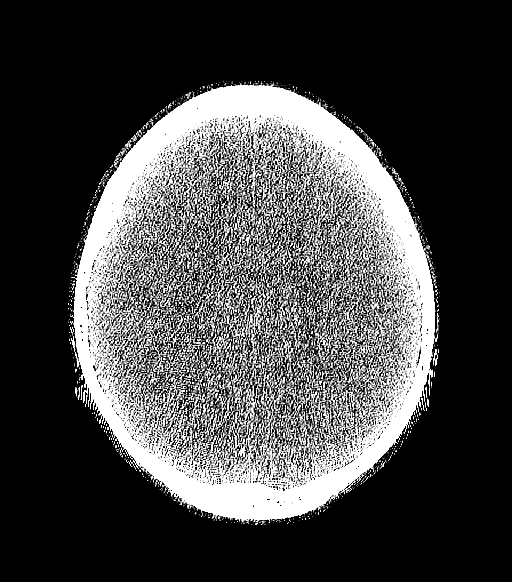
[im 61/76  brain]
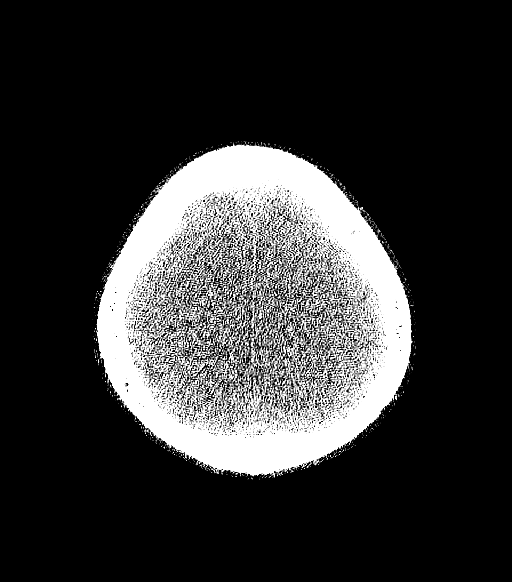
[im 68/76  brain]
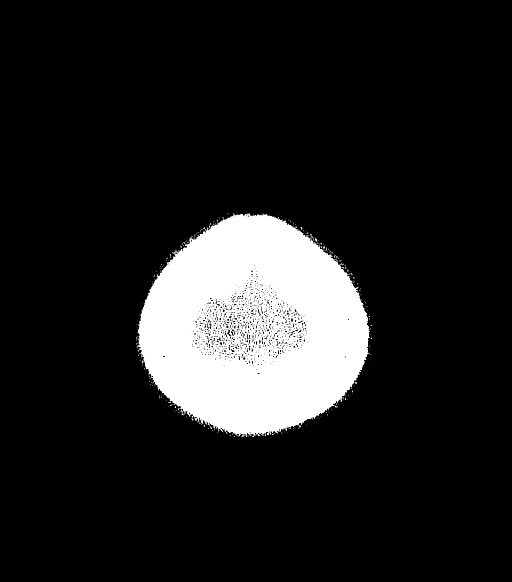

[Series 6: head · coronal · 0.30mm/px · 3 of 71 slices shown (2 of 3)]
[im 24/71  brain]
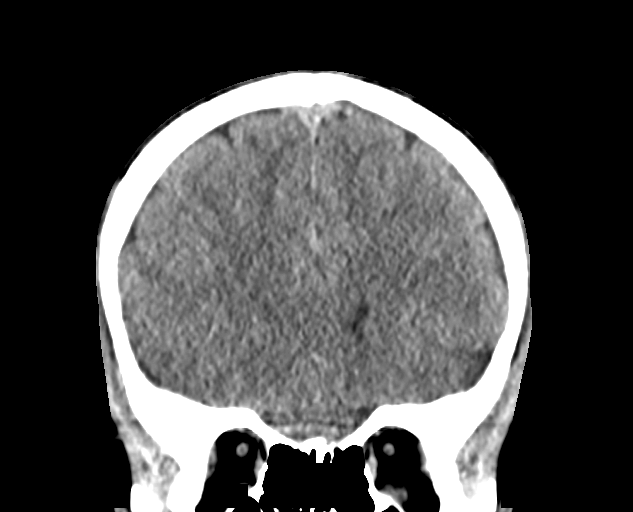
[im 32/71  brain]
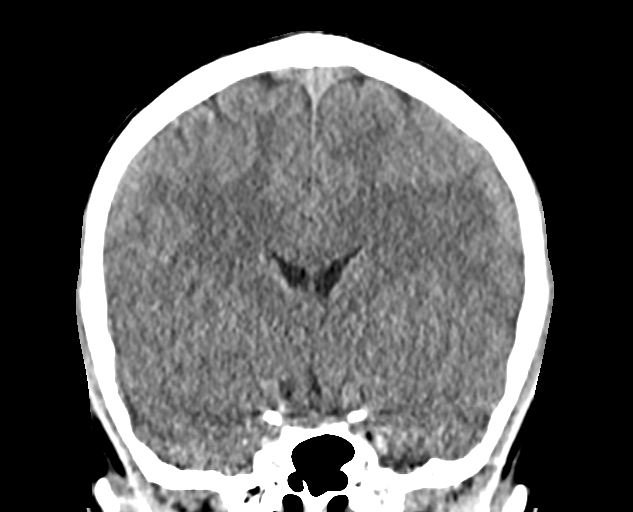
[im 39/71  brain]
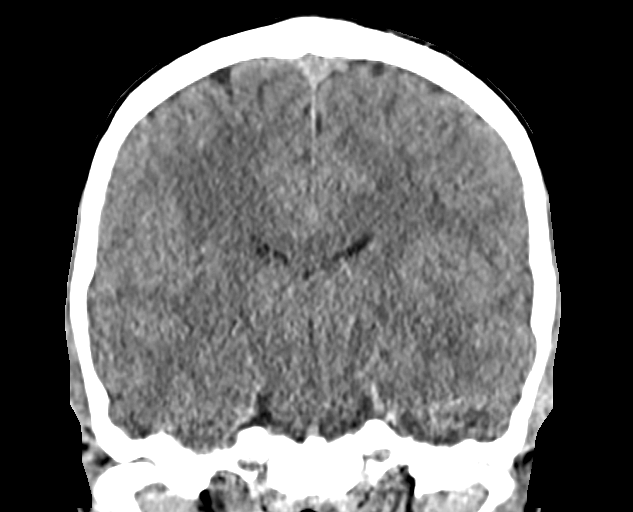

[Series 8: head · sagittal · 0.30mm/px · 3 of 63 slices shown (3 of 3)]
[im 21/63  brain]
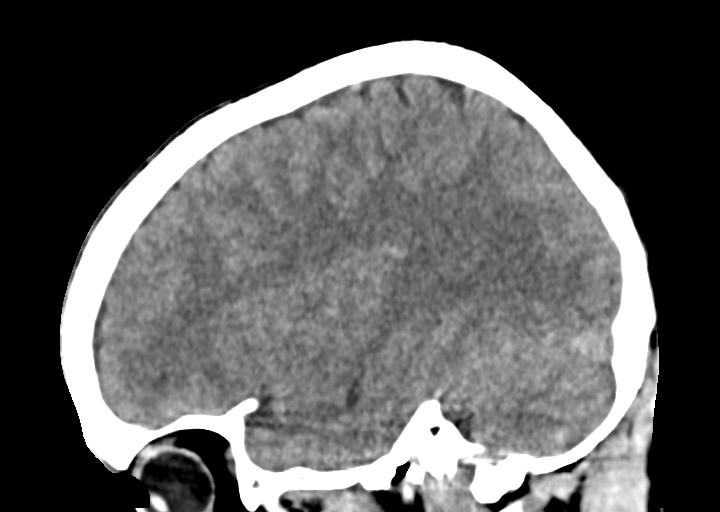
[im 32/63  brain]
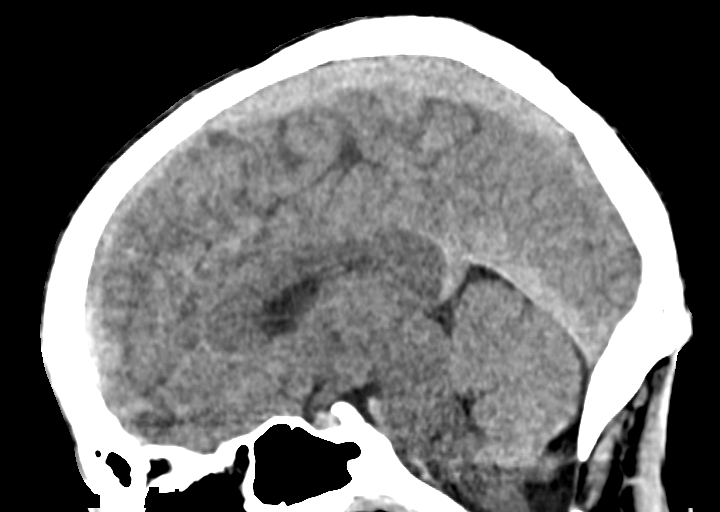
[im 42/63  brain]
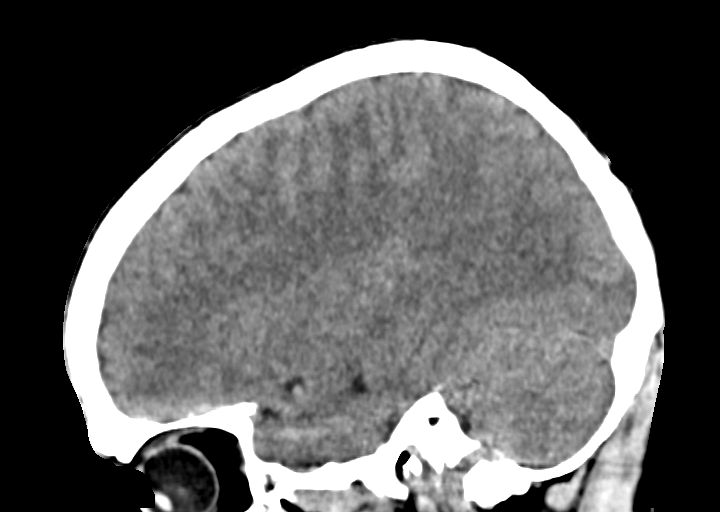

[14 of 47 positions shown; findings below may reference images not displayed]

FINDINGS: Brain: Stable CSF density structure favoring dilated perivascular
space along the inferior margin of the right lentiform nucleus-no
change from 04/13/2013.

The brainstem, cerebellum, cerebral peduncles, thalami, basal
ganglia, basilar cisterns, and ventricular system appear within
normal limits. No intracranial hemorrhage, mass lesion, or acute
CVA.

Vascular: Unremarkable

Skull: Unremarkable

Sinuses/Orbits: Unremarkable

Other: No supplemental non-categorized findings.
IMPRESSION: 1. No acute intracranial findings.
2. Chronically stable appearance of a CSF density structure below
the right lentiform nucleus, likely a dilated perivascular space.

These results will be called to the ordering clinician or
representative by the [HOSPITAL] at the imaging location.

## 2023-08-16 ENCOUNTER — Ambulatory Visit (INDEPENDENT_AMBULATORY_CARE_PROVIDER_SITE_OTHER): Payer: Managed Care, Other (non HMO)

## 2023-08-16 ENCOUNTER — Ambulatory Visit
Admission: EM | Admit: 2023-08-16 | Discharge: 2023-08-16 | Disposition: A | Discharge status: Home / Self Care | Payer: Managed Care, Other (non HMO) | Attending: Emergency Medicine | Admitting: Emergency Medicine

## 2023-08-16 DIAGNOSIS — N200 Calculus of kidney: Secondary | ICD-10-CM | POA: Diagnosis present

## 2023-08-16 DIAGNOSIS — R319 Hematuria, unspecified: Secondary | ICD-10-CM

## 2023-08-16 LAB — CBC WITH DIFFERENTIAL/PLATELET
Abs Immature Granulocytes: 0.02 10*3/uL (ref 0.00–0.07)
Basophils Absolute: 0.1 10*3/uL (ref 0.0–0.1)
Basophils Relative: 1 %
Eosinophils Absolute: 0.1 10*3/uL (ref 0.0–0.5)
Eosinophils Relative: 2 %
HCT: 45.8 % (ref 39.0–52.0)
Hemoglobin: 15.6 g/dL (ref 13.0–17.0)
Immature Granulocytes: 0 %
Lymphocytes Relative: 22 %
Lymphs Abs: 1.5 10*3/uL (ref 0.7–4.0)
MCH: 32.2 pg (ref 26.0–34.0)
MCHC: 34.1 g/dL (ref 30.0–36.0)
MCV: 94.4 fL (ref 80.0–100.0)
Monocytes Absolute: 0.4 10*3/uL (ref 0.1–1.0)
Monocytes Relative: 7 %
Neutro Abs: 4.5 10*3/uL (ref 1.7–7.7)
Neutrophils Relative %: 68 %
Platelets: 212 10*3/uL (ref 150–400)
RBC: 4.85 MIL/uL (ref 4.22–5.81)
RDW: 12 % (ref 11.5–15.5)
WBC: 6.6 10*3/uL (ref 4.0–10.5)
nRBC: 0 % (ref 0.0–0.2)

## 2023-08-16 LAB — URINALYSIS, W/ REFLEX TO CULTURE (INFECTION SUSPECTED)
Bacteria, UA: NONE SEEN
Bilirubin Urine: NEGATIVE
Glucose, UA: NEGATIVE mg/dL
Ketones, ur: NEGATIVE mg/dL
Leukocytes,Ua: NEGATIVE
Nitrite: NEGATIVE
Specific Gravity, Urine: 1.025 (ref 1.005–1.030)
Squamous Epithelial / HPF: NONE SEEN /HPF (ref 0–5)
WBC, UA: NONE SEEN WBC/hpf (ref 0–5)
pH: 7 (ref 5.0–8.0)

## 2023-08-16 LAB — BASIC METABOLIC PANEL
Anion gap: 8 (ref 5–15)
BUN: 14 mg/dL (ref 6–20)
CO2: 27 mmol/L (ref 22–32)
Calcium: 9.6 mg/dL (ref 8.9–10.3)
Chloride: 100 mmol/L (ref 98–111)
Creatinine, Ser: 1.03 mg/dL (ref 0.61–1.24)
GFR, Estimated: >60 mL/min (ref >60)
Glucose, Bld: 164 mg/dL — ABNORMAL HIGH (ref 70–99)
Potassium: 4.6 mmol/L (ref 3.5–5.1)
Sodium: 135 mmol/L (ref 135–145)

## 2023-08-16 MED ORDER — KETOROLAC TROMETHAMINE 10 MG PO TABS: 10.0000 mg | ORAL_TABLET | Freq: Four times a day (QID) | ORAL | 0 refills | Status: AC | PRN

## 2023-08-16 MED ORDER — TAMSULOSIN HCL 0.4 MG PO CAPS: 0.4000 mg | ORAL_CAPSULE | Freq: Every day | ORAL | 0 refills | Status: AC

## 2023-08-16 MED ORDER — KETOROLAC TROMETHAMINE 30 MG/ML IJ SOLN
30.0000 mg | Freq: Once | INTRAMUSCULAR | Status: AC
  Administered 2023-08-16: 30 mg via INTRAMUSCULAR

## 2023-08-16 NOTE — ED Provider Notes (Signed)
[facility]    CSN: [id number] Arrival date & time: 08/16/23  0913      History   Chief Complaint Chief Complaint  Patient presents with   Urinary Frequency    HPI Dustin Butler is a 27 y.o. male.   27 year old male pt, Dustin Butler, presents to urgent care for evaluation of hematuria x 4 days, body aches, urine frequency. Pt denies any known illness, trauma. Pt has hx of kidney stones. No treatment tried PTA.  The history is provided by the patient. No language interpreter was used.    Past Medical History:  Diagnosis Date   Abdominal cramps    Anemia    BRBPR (bright red blood per rectum)    Complication of anesthesia    WAS GIVEN TOO MUCH ANESTHESIA FOR HIS WISDOM TEETH AND WAS HARD TO WAKE UP   GERD (gastroesophageal reflux disease)    Hematuria    History of kidney stones     Patient Active Problem List   Diagnosis Date Noted   Kidney stone 08/16/2023   Hematuria 08/16/2023   Headache(784.0) 04/14/2013   Myalgia 04/14/2013   Dehydration 04/14/2013    Past Surgical History:  Procedure Laterality Date   COLONOSCOPY WITH PROPOFOL N/A 10/24/2015   Procedure: COLONOSCOPY WITH PROPOFOL;  Surgeon: Wallace Cullens, MD;  Location: [facility] ENDOSCOPY;  Service: Gastroenterology;  Laterality: N/A;   CYSTOSCOPY WITH STENT PLACEMENT Left 12/25/2016   Procedure: CYSTOSCOPY WITH STENT PLACEMENT;  Surgeon: Vanna Scotland, MD;  Location: [facility] ORS;  Service: Urology;  Laterality: Left;   ELBOW SURGERY     spider bite     URETEROSCOPY WITH HOLMIUM LASER LITHOTRIPSY Left 12/25/2016   Procedure: URETEROSCOPY WITH HOLMIUM LASER LITHOTRIPSY;  Surgeon: Vanna Scotland, MD;  Location: [facility] ORS;  Service: Urology;  Laterality: Left;   WISDOM TOOTH EXTRACTION         Home Medications    Prior to Admission medications   Medication Sig Start Date End Date Taking? Authorizing Provider  ketorolac (TORADOL) 10 MG tablet Take 1 tablet (10 mg total) by mouth every 6 (six)  hours as needed for up to 5 days. 08/16/23 08/21/23 Yes Demico Ploch, Para March, NP  tamsulosin (FLOMAX) 0.4 MG CAPS capsule Take 1 capsule (0.4 mg total) by mouth daily for 7 days. 08/16/23 08/23/23 Yes Fredda Clarida, Para March, NP  docusate sodium (COLACE) 100 MG capsule Take 1 capsule (100 mg total) by mouth 2 (two) times daily. 12/25/16   Vanna Scotland, MD  HYDROcodone-acetaminophen (NORCO/VICODIN) 5-325 MG tablet Take 1 tablet by mouth every 4 (four) hours as needed for moderate pain. 12/25/16   Vanna Scotland, MD  ondansetron (ZOFRAN ODT) 4 MG disintegrating tablet Take 1 tablet (4 mg total) by mouth every 8 (eight) hours as needed for nausea or vomiting. 12/14/16   Jene Every, MD  oxybutynin (DITROPAN) 5 MG tablet Take 1 tablet (5 mg total) by mouth every 8 (eight) hours as needed for bladder spasms. 12/25/16   Vanna Scotland, MD    Family History Family History  Problem Relation Age of Onset   Prostate cancer Father    Kidney cancer Neg Hx    Bladder Cancer Neg Hx     Social History Social History   Tobacco Use   Smoking status: Every Day    Current packs/day: 1.00    Average packs/day: 1 pack/day for 2.0 years (2.0 ttl pk-yrs)    Types: Cigarettes   Smokeless tobacco: Former    Types: Sports administrator  Vaping Use   Vaping status: Every Day  Substance Use Topics   Alcohol use: Yes    Comment: occ   Drug use: No     Allergies   Patient has no known allergies.   Review of Systems Review of Systems  Constitutional:  Negative for fever.  Genitourinary:  Positive for frequency and hematuria.  Musculoskeletal:  Positive for myalgias.  All other systems reviewed and are negative.    Physical Exam Triage Vital Signs ED Triage Vitals  Encounter Vitals Group     BP      Systolic BP Percentile      Diastolic BP Percentile      Pulse      Resp      Temp      Temp src      SpO2      Weight      Height      Head Circumference      Peak Flow      Pain Score      Pain Loc      Pain  Education      Exclude from Growth Chart    No data found.  Updated Vital Signs BP 124/88 (BP Location: Left Arm)   Pulse 72   Temp 97.9 F (36.6 C) (Oral)   Resp 17   Wt 120 lb (54.4 kg)   SpO2 100%   BMI 16.27 kg/m   Visual Acuity Right Eye Distance:   Left Eye Distance:   Bilateral Distance:    Right Eye Near:   Left Eye Near:    Bilateral Near:     Physical Exam   UC Treatments / Results  Labs (all labs ordered are listed, but only abnormal results are displayed) Labs Reviewed  URINALYSIS, W/ REFLEX TO CULTURE (INFECTION SUSPECTED) - Abnormal; Notable for the following components:      Result Value   Color, Urine AMBER (*)    APPearance CLOUDY (*)    Hgb urine dipstick LARGE (*)    Protein, ur TRACE (*)    All other components within normal limits  BASIC METABOLIC PANEL - Abnormal; Notable for the following components:   Glucose, Bld 164 (*)    All other components within normal limits  CBC WITH DIFFERENTIAL/PLATELET    EKG   Radiology DG Abdomen 1 View  Result Date: 08/16/2023 CLINICAL DATA:  Rule out kidney stone.  Hematuria. EXAM: ABDOMEN - 1 VIEW COMPARISON:  12/14/2016 FINDINGS: Small punctate calcifications in the expected location of the upper pole of right kidney are identified measuring up to 3 mm. In the expected location of the proximal right ureter there is a 3 mm calcification adjacent to the right transverse process of the L3 vertebra. No additional calcifications identified along the course of the kidneys or ureters or urinary bladder. Bowel gas pattern appears within normal limits. IMPRESSION: 1. 3 mm calcification in the expected location of the proximal right ureter adjacent to the right transverse process of the L3 vertebra. This may represent a proximal right ureteral calculus. 2. Small punctate calcifications in the expected location of the upper pole of the right kidney. Electronically Signed   By: Signa Kell M.D.   On: 08/16/2023 11:12     Procedures Procedures (including critical care time)  Medications Ordered in UC Medications  ketorolac (TORADOL) 30 MG/ML injection 30 mg (30 mg Intramuscular Given 08/16/23 1134)    Initial Impression / Assessment and Plan / UC Course  I have reviewed the triage vital signs and the nursing notes.  Pertinent labs & imaging results that were available during my care of the patient were reviewed by me and considered in my medical decision making (see chart for details).  Clinical Course as of 08/16/23 1136  Fri Aug 16, 2023  0918 CBC, UA, and BMP ordered for symptoms of body aches and hematuria x 4 days. [JD]  0943 Cbc is normal awaiting UA and BMP results. [JD]  1118 Bmp shows hyperglycemia at 164, kidney function normal, pt ate orange juice and honey bun PTA. [JD]    Clinical Course User Index [JD] Laurren Lepkowski, Para March, NP   Discussed exam findings and plan of care with patient, strict go to ER precautions given.   Patient verbalized understanding to this provider.  Ddx: Kidney stone, hematuria, urinary frequency Final Clinical Impressions(s) / UC Diagnoses   Final diagnoses:  Kidney stone  Hematuria, unspecified type     Discharge Instructions      Your kidney function at today's visit is normal, drink plenty of water, avoid caffeine, take flomax/toradol  as directed. You have a kidney stone noted right proximal ureter, should pass, if you develop nausea,vomiting,fever,worsening pain go to ER for further evaluation and treatment.      ED Prescriptions     Medication Sig Dispense Auth. Provider   tamsulosin (FLOMAX) 0.4 MG CAPS capsule Take 1 capsule (0.4 mg total) by mouth daily for 7 days. 7 capsule Zia Najera, NP   ketorolac (TORADOL) 10 MG tablet Take 1 tablet (10 mg total) by mouth every 6 (six) hours as needed for up to 5 days. 20 tablet Oshay Stranahan, Para March, NP      PDMP not reviewed this encounter.   Clancy Gourd, NP 08/16/23 1136

## 2023-08-16 NOTE — Discharge Instructions (Addendum)
Your kidney function at today's visit is normal, drink plenty of water, avoid caffeine, take flomax/toradol  as directed. You have a kidney stone noted right proximal ureter, should pass, if you develop nausea,vomiting,fever,worsening pain go to ER for further evaluation and treatment.

## 2023-08-16 NOTE — ED Triage Notes (Signed)
Sx started 4 days ago. Body aches, blood in urine, urine frequency.

## 2023-12-11 ENCOUNTER — Ambulatory Visit: Payer: Federal, State, Local not specified - PPO | Admitting: Urology

## 2023-12-12 ENCOUNTER — Ambulatory Visit (INDEPENDENT_AMBULATORY_CARE_PROVIDER_SITE_OTHER): Payer: Self-pay | Admitting: Urology

## 2023-12-12 VITALS — BP 125/84 | HR 93 | Ht 71.0 in | Wt 124.4 lb

## 2023-12-12 DIAGNOSIS — R310 Gross hematuria: Secondary | ICD-10-CM

## 2023-12-12 DIAGNOSIS — N200 Calculus of kidney: Secondary | ICD-10-CM

## 2023-12-12 DIAGNOSIS — R1032 Left lower quadrant pain: Secondary | ICD-10-CM

## 2023-12-12 DIAGNOSIS — R361 Hematospermia: Secondary | ICD-10-CM

## 2023-12-12 LAB — URINALYSIS, COMPLETE

## 2023-12-12 LAB — MICROSCOPIC EXAMINATION: RBC, Urine: >30 /[HPF] — AB (ref 0–2)

## 2023-12-12 MED ORDER — TAMSULOSIN HCL 0.4 MG PO CAPS: 0.4000 mg | ORAL_CAPSULE | Freq: Every day | ORAL | 1 refills | Status: AC

## 2023-12-12 NOTE — Progress Notes (Signed)
 Dustin Butler Dustin Butler,acting as a scribe for Dustin Riis, MD.,have documented all relevant documentation on the behalf of Dustin Riis, MD,as directed by  Dustin Riis, MD while in the presence of Dustin Riis, MD.  12/12/23 12:02 PM   Dustin Butler 1996-08-07 [id number]  Referring provider: Alla Amis, MD [phone] [facility] [address] [facility] [place],  [place] [address]  Chief Complaint  Patient presents with   Establish Care   Nephrolithiasis    HPI: 28 year-old male who presents today for further evaluation of a kidney stone.   He has a personal history of kidney stone disease and underwent a left ureteroscopy by me in 2018. He returns today with concerns of another stone event.   He was seen and evaluated in [place] urgent care on 08/16/2023 with several days of gross hematuria. He underwent a KUB which was suggestive at the time of a punctate right kidney stone as well as a 3 mm right ureteral stone at the level of L3. His urine did show too numerous to count RBC as well as calcium oxalate crystals at the time.   Urinalysis today shows >30 RBC per high powered field.   He reports several days of gross hematuria and aching pain, primarily on both sides, since September. Pain has fluctuated, worsening around Christmas. No fevers or chills reported. He reports no burning sensation during urination, although a little was noted. He has not observed passing the stone.  He has made dietary changes, including quitting dark sodas like [facility], and is drinking more water.  His family history includes his father being treated for kidney stones and later diagnosed with prostate cancer.  PMH: Past Medical History:  Diagnosis Date   Abdominal cramps    Anemia    BRBPR (bright red blood per rectum)    Complication of anesthesia    WAS GIVEN TOO MUCH ANESTHESIA FOR HIS WISDOM TEETH AND WAS HARD TO WAKE UP   GERD (gastroesophageal reflux disease)    Hematuria     History of kidney stones     Surgical History: Past Surgical History:  Procedure Laterality Date   COLONOSCOPY WITH PROPOFOL  N/A 10/24/2015   Procedure: COLONOSCOPY WITH PROPOFOL ;  Surgeon: Dustin Butler [place], MD;  Location: [facility] ENDOSCOPY;  Service: Gastroenterology;  Laterality: N/A;   CYSTOSCOPY WITH STENT PLACEMENT Left 12/25/2016   Procedure: CYSTOSCOPY WITH STENT PLACEMENT;  Surgeon: Dustin Riis, MD;  Location: [facility] ORS;  Service: Urology;  Laterality: Left;   ELBOW SURGERY     spider bite     URETEROSCOPY WITH HOLMIUM LASER LITHOTRIPSY Left 12/25/2016   Procedure: URETEROSCOPY WITH HOLMIUM LASER LITHOTRIPSY;  Surgeon: Dustin Riis, MD;  Location: [facility] ORS;  Service: Urology;  Laterality: Left;   WISDOM TOOTH EXTRACTION      Home Medications:  Allergies as of 12/12/2023   No Known Allergies      Medication List        Accurate as of December 12, 2023 12:02 PM. If you have any questions, ask your nurse or doctor.          STOP taking these medications    docusate sodium  100 MG capsule Commonly known as: COLACE   HYDROcodone -acetaminophen  5-325 MG tablet Commonly known as: NORCO/VICODIN   oxybutynin  5 MG tablet Commonly known as: DITROPAN        TAKE these medications    ondansetron  4 MG disintegrating tablet Commonly known as: Zofran  ODT Take 1 tablet (4 mg total) by mouth every  8 (eight) hours as needed for nausea or vomiting.   tamsulosin  0.4 MG Caps capsule Commonly known as: FLOMAX  Take 1 capsule (0.4 mg total) by mouth daily.        Family History: Family History  Problem Relation Age of Onset   Prostate cancer Father    Kidney cancer Neg Hx    Bladder Cancer Neg Hx     Social History:  reports that he has been smoking cigarettes. He has a 2 pack-year smoking history. He has quit using smokeless tobacco.  His smokeless tobacco use included chew. He reports current alcohol use. He reports that he does not use drugs.   Physical Exam: BP 125/84    Pulse 93   Ht 5' 11 (1.803 m)   Wt 124 lb 6 oz (56.4 kg)   BMI 17.35 kg/m   Constitutional:  Alert and oriented, No acute distress.  Accompanied today by his wife HEENT: [place] AT, moist mucus membranes.  Trachea midline, no masses. Neurologic: Grossly intact, no focal deficits, moving all 4 extremities. Psychiatric: Normal mood and affect.  Pertinent Imaging: EXAM: ABDOMEN - 1 VIEW   COMPARISON:  12/14/2016   FINDINGS: Small punctate calcifications in the expected location of the upper pole of right kidney are identified measuring up to 3 mm. In the expected location of the proximal right ureter there is a 3 mm calcification adjacent to the right transverse process of the L3 vertebra. No additional calcifications identified along the course of the kidneys or ureters or urinary bladder. Bowel gas pattern appears within normal limits.   IMPRESSION: 1. 3 mm calcification in the expected location of the proximal right ureter adjacent to the right transverse process of the L3 vertebra. This may represent a proximal right ureteral calculus. 2. Small punctate calcifications in the expected location of the upper pole of the right kidney.     Electronically Signed   By: Dustin Butler M.D.   On: 08/16/2023 11:12  This was personally reviewed and I agree with the radiologic interpretation.   Assessment & Plan:    1. Right ureteral stone - CT stone protocol to evaluate the current status of the stone and any potential complications. - Dietary counseling provided regarding stones - Stone composition likely calcium oxalate based on presence in recent urinalysis - We discussed various treatment options for urolithiasis including observation with or without medical expulsive therapy, shockwave lithotripsy (SWL), ureteroscopy and laser lithotripsy with stent placement, and percutaneous nephrolithotomy.   We discussed that management is based on stone size, location, density, patient  co-morbidities, and patient preference.    Stones <16mm in size have a >80% spontaneous passage rate. Data surrounding the use of tamsulosin  for medical expulsive therapy is controversial, but meta analyses suggests it is most efficacious for distal stones between 5-28mm in size. Possible side effects include dizziness/lightheadedness, and retrograde ejaculation.   SWL has a lower stone free rate in a single procedure, but also a lower complication rate compared to ureteroscopy and avoids a stent and associated stent related symptoms. Possible complications include renal hematoma, steinstrasse, and need for additional treatment. We discussed the role of his increased skin to stone distance can lead to decreased efficacy with shockwave lithotripsy.   Ureteroscopy with laser lithotripsy and stent placement has a higher stone free rate than SWL in a single procedure, however increased complication rate including possible infection, ureteral injury, bleeding, and stent related morbidity. Common stent related symptoms include dysuria, urgency/frequency, and flank pain.  After an extensive discussion of the risks and benefits of the above treatment options, the patient would like to proceed with SWL if the stone is still present and amenable.  Will plan to call him with the CT scan results and follow-up plan based on this.  He will send me a MyChart message when the scan is complete for me to review given the delay in radiologic interpretation.  2. Gross hematuria - Likely secondary to ureteral stone, possible infection. - Send urine for culture to rule out infection.  - Monitor for resolution post stone management.  3. Hematospermia  I explained to the patient some of the conditions that may cause hematospermia, such as: disorders of the prostate gland, seminal vesicles, spermatic cord, and ejaculatory duct system; urogenital infections including sexually transmitted infections (eg, chlamydia, herpes  simplex virus, gonorrhea, trichomonas); metastatic cancers; vascular malformations; congenital and drug-induced bleeding disorders; and even frequent daily ejaculation over a period of several weeks.  In this incident, likely related to hematuria and stone event.   I have reviewed the above documentation for accuracy and completeness, and I agree with the above.   Dustin Riis, MD   [facility] [address] [place], [place] [address] [phone]

## 2023-12-15 LAB — CULTURE, URINE COMPREHENSIVE

## 2023-12-16 ENCOUNTER — Encounter: Payer: Self-pay | Admitting: Urology

## 2023-12-27 ENCOUNTER — Ambulatory Visit
Admission: RE | Admit: 2023-12-27 | Discharge: 2023-12-27 | Disposition: A | Discharge status: Home / Self Care | Payer: Managed Care, Other (non HMO) | Source: Ambulatory Visit | Attending: Urology

## 2023-12-27 DIAGNOSIS — R1032 Left lower quadrant pain: Secondary | ICD-10-CM | POA: Diagnosis present

## 2023-12-27 DIAGNOSIS — R310 Gross hematuria: Secondary | ICD-10-CM | POA: Diagnosis present

## 2023-12-27 DIAGNOSIS — N200 Calculus of kidney: Secondary | ICD-10-CM | POA: Diagnosis present

## 2023-12-27 DIAGNOSIS — R361 Hematospermia: Secondary | ICD-10-CM | POA: Diagnosis present
